# Patient Record
Sex: Female | Born: 1990 | Race: White | Hispanic: No | Marital: Married | Smoking: Never smoker
Health system: Southern US, Community
[De-identification: ages and names within clinical notes are randomized; demographics above are authoritative.]

## PROBLEM LIST (undated history)

## (undated) ENCOUNTER — Inpatient Hospital Stay: Payer: Self-pay

## (undated) DIAGNOSIS — R519 Headache, unspecified: Secondary | ICD-10-CM

## (undated) DIAGNOSIS — S83209A Unspecified tear of unspecified meniscus, current injury, unspecified knee, initial encounter: Secondary | ICD-10-CM

## (undated) DIAGNOSIS — Z8614 Personal history of Methicillin resistant Staphylococcus aureus infection: Secondary | ICD-10-CM

## (undated) DIAGNOSIS — R748 Abnormal levels of other serum enzymes: Secondary | ICD-10-CM

## (undated) DIAGNOSIS — R51 Headache: Secondary | ICD-10-CM

---
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
## 2010-01-01 ENCOUNTER — Emergency Department: Payer: Self-pay | Admitting: Emergency Medicine

## 2010-01-07 ENCOUNTER — Inpatient Hospital Stay: Payer: Self-pay | Admitting: Obstetrics and Gynecology

## 2010-02-16 ENCOUNTER — Emergency Department: Payer: Self-pay | Admitting: Emergency Medicine

## 2010-03-01 ENCOUNTER — Emergency Department: Payer: Self-pay | Admitting: Unknown Physician Specialty

## 2010-07-18 ENCOUNTER — Observation Stay: Payer: Self-pay | Admitting: Obstetrics and Gynecology

## 2010-07-27 ENCOUNTER — Inpatient Hospital Stay: Payer: Self-pay

## 2010-09-11 LAB — HM PAP SMEAR: HM Pap smear: NEGATIVE

## 2010-12-25 ENCOUNTER — Emergency Department: Payer: Self-pay | Admitting: Emergency Medicine

## 2010-12-27 ENCOUNTER — Emergency Department: Payer: Self-pay | Admitting: Emergency Medicine

## 2011-05-27 ENCOUNTER — Emergency Department: Payer: Self-pay | Admitting: *Deleted

## 2011-09-20 ENCOUNTER — Emergency Department: Payer: Self-pay | Admitting: Emergency Medicine

## 2011-09-20 LAB — CBC WITH DIFFERENTIAL/PLATELET
Basophil #: 0 10*3/uL (ref 0.0–0.1)
Basophil %: 0.4 %
Eosinophil #: 0.1 10*3/uL (ref 0.0–0.7)
Eosinophil %: 1.2 %
HGB: 12.3 g/dL (ref 12.0–16.0)
Lymphocyte #: 1.9 10*3/uL (ref 1.0–3.6)
Lymphocyte %: 18.6 %
MCH: 28.8 pg (ref 26.0–34.0)
MCHC: 33.6 g/dL (ref 32.0–36.0)
Monocyte #: 0.7 10*3/uL (ref 0.0–0.7)
Monocyte %: 6.8 %
Neutrophil #: 7.3 10*3/uL — ABNORMAL HIGH (ref 1.4–6.5)
Neutrophil %: 73 %
Platelet: 147 10*3/uL — ABNORMAL LOW (ref 150–440)
RDW: 13.3 % (ref 11.5–14.5)
WBC: 10 10*3/uL (ref 3.6–11.0)

## 2011-09-21 ENCOUNTER — Inpatient Hospital Stay: Payer: Self-pay | Admitting: Internal Medicine

## 2011-09-21 LAB — CBC
HCT: 37.7 % (ref 35.0–47.0)
HGB: 12.7 g/dL (ref 12.0–16.0)
MCH: 29 pg (ref 26.0–34.0)
MCHC: 33.7 g/dL (ref 32.0–36.0)
Platelet: 143 10*3/uL — ABNORMAL LOW (ref 150–440)
RBC: 4.37 10*6/uL (ref 3.80–5.20)
RDW: 13.7 % (ref 11.5–14.5)

## 2011-09-21 LAB — COMPREHENSIVE METABOLIC PANEL
Albumin: 3.9 g/dL (ref 3.4–5.0)
Alkaline Phosphatase: 85 U/L (ref 50–136)
Anion Gap: 11 (ref 7–16)
Co2: 25 mmol/L (ref 21–32)
EGFR (African American): >60
EGFR (Non-African Amer.): >60
Glucose: 125 mg/dL — ABNORMAL HIGH (ref 65–99)
Total Protein: 7.4 g/dL (ref 6.4–8.2)

## 2011-09-22 LAB — CBC WITH DIFFERENTIAL/PLATELET
Eosinophil %: 2.2 %
HGB: 11.8 g/dL — ABNORMAL LOW (ref 12.0–16.0)
Lymphocyte #: 1.6 10*3/uL (ref 1.0–3.6)
Lymphocyte %: 21.8 %
MCH: 29 pg (ref 26.0–34.0)
MCHC: 34.1 g/dL (ref 32.0–36.0)
MCV: 85 fL (ref 80–100)
Monocyte %: 7.6 %
Platelet: 136 10*3/uL — ABNORMAL LOW (ref 150–440)

## 2011-09-22 LAB — VANCOMYCIN, TROUGH: Vancomycin, Trough: 18 ug/mL (ref 10–20)

## 2011-09-25 LAB — WOUND CULTURE

## 2011-12-05 LAB — COMPREHENSIVE METABOLIC PANEL
Albumin: 3.9 g/dL (ref 3.4–5.0)
Alkaline Phosphatase: 85 U/L (ref 50–136)
Anion Gap: 7 (ref 7–16)
Chloride: 105 mmol/L (ref 98–107)
Co2: 31 mmol/L (ref 21–32)
Creatinine: 0.64 mg/dL (ref 0.60–1.30)
EGFR (African American): >60
EGFR (Non-African Amer.): >60
Glucose: 89 mg/dL (ref 65–99)
Osmolality: 285 (ref 275–301)
SGPT (ALT): 76 U/L
Sodium: 143 mmol/L (ref 136–145)
Total Protein: 7.4 g/dL (ref 6.4–8.2)

## 2011-12-05 LAB — CBC
HCT: 41.4 % (ref 35.0–47.0)
MCHC: 32.9 g/dL (ref 32.0–36.0)
MCV: 88 fL (ref 80–100)
Platelet: 164 10*3/uL (ref 150–440)
RBC: 4.7 10*6/uL (ref 3.80–5.20)
RDW: 14 % (ref 11.5–14.5)

## 2011-12-06 ENCOUNTER — Inpatient Hospital Stay: Payer: Self-pay | Admitting: Internal Medicine

## 2011-12-06 LAB — CBC WITH DIFFERENTIAL/PLATELET
Basophil #: 0 10*3/uL (ref 0.0–0.1)
Basophil %: 0.4 %
Lymphocyte %: 36.5 %
MCH: 29.4 pg (ref 26.0–34.0)
MCV: 87 fL (ref 80–100)
Monocyte #: 0.7 x10 3/mm (ref 0.2–0.9)
Monocyte %: 7.9 %
Neutrophil %: 52.8 %
RBC: 4.65 10*6/uL (ref 3.80–5.20)
WBC: 9.1 10*3/uL (ref 3.6–11.0)

## 2011-12-06 LAB — BASIC METABOLIC PANEL
Anion Gap: 6 — ABNORMAL LOW (ref 7–16)
Calcium, Total: 8.8 mg/dL (ref 8.5–10.1)
Chloride: 104 mmol/L (ref 98–107)
Co2: 31 mmol/L (ref 21–32)
Creatinine: 0.51 mg/dL — ABNORMAL LOW (ref 0.60–1.30)
EGFR (Non-African Amer.): >60

## 2011-12-06 LAB — DRUG SCREEN, URINE
Amphetamines, Ur Screen: NEGATIVE (ref ?–1000)
Barbiturates, Ur Screen: NEGATIVE (ref ?–200)
Cocaine Metabolite,Ur ~~LOC~~: NEGATIVE (ref ?–300)
MDMA (Ecstasy)Ur Screen: NEGATIVE (ref ?–500)
Methadone, Ur Screen: NEGATIVE (ref ?–300)
Phencyclidine (PCP) Ur S: NEGATIVE (ref ?–25)
Tricyclic, Ur Screen: NEGATIVE (ref ?–1000)

## 2011-12-06 LAB — HEMOGLOBIN A1C: Hemoglobin A1C: 5.2 % (ref 4.2–6.3)

## 2011-12-06 LAB — HCG, QUANTITATIVE, PREGNANCY: Beta Hcg, Quant.: <1 m[IU]/mL — ABNORMAL LOW

## 2011-12-11 LAB — CULTURE, BLOOD (SINGLE)

## 2011-12-17 LAB — WOUND CULTURE

## 2013-09-15 ENCOUNTER — Emergency Department: Payer: Self-pay | Admitting: Emergency Medicine

## 2013-09-15 LAB — CBC
HCT: 38.8 % (ref 35.0–47.0)
HGB: 13 g/dL (ref 12.0–16.0)
MCH: 28.8 pg (ref 26.0–34.0)
MCHC: 33.7 g/dL (ref 32.0–36.0)
MCV: 86 fL (ref 80–100)
PLATELETS: 171 10*3/uL (ref 150–440)
RBC: 4.54 10*6/uL (ref 3.80–5.20)
RDW: 13.7 % (ref 11.5–14.5)
WBC: 9.8 10*3/uL (ref 3.6–11.0)

## 2013-09-15 LAB — COMPREHENSIVE METABOLIC PANEL
ALT: 21 U/L (ref 12–78)
ANION GAP: 5 — AB (ref 7–16)
Albumin: 4 g/dL (ref 3.4–5.0)
Alkaline Phosphatase: 65 U/L
BILIRUBIN TOTAL: 0.3 mg/dL (ref 0.2–1.0)
BUN: 12 mg/dL (ref 7–18)
CALCIUM: 9 mg/dL (ref 8.5–10.1)
CHLORIDE: 110 mmol/L — AB (ref 98–107)
CO2: 26 mmol/L (ref 21–32)
CREATININE: 0.77 mg/dL (ref 0.60–1.30)
EGFR (Non-African Amer.): >60
GLUCOSE: 97 mg/dL (ref 65–99)
Osmolality: 281 (ref 275–301)
Potassium: 3.7 mmol/L (ref 3.5–5.1)
SGOT(AST): 9 U/L — ABNORMAL LOW (ref 15–37)
SODIUM: 141 mmol/L (ref 136–145)
TOTAL PROTEIN: 7.5 g/dL (ref 6.4–8.2)

## 2013-10-14 ENCOUNTER — Emergency Department: Payer: Self-pay | Admitting: Emergency Medicine

## 2013-10-16 ENCOUNTER — Emergency Department: Payer: Self-pay | Admitting: Emergency Medicine

## 2013-10-16 LAB — COMPREHENSIVE METABOLIC PANEL
ALBUMIN: 3.8 g/dL (ref 3.4–5.0)
ALK PHOS: 104 U/L
ANION GAP: 7 (ref 7–16)
BUN: 10 mg/dL (ref 7–18)
Bilirubin,Total: 0.3 mg/dL (ref 0.2–1.0)
CHLORIDE: 105 mmol/L (ref 98–107)
Calcium, Total: 8.9 mg/dL (ref 8.5–10.1)
Co2: 26 mmol/L (ref 21–32)
Creatinine: 0.63 mg/dL (ref 0.60–1.30)
EGFR (African American): >60
Glucose: 102 mg/dL — ABNORMAL HIGH (ref 65–99)
Osmolality: 275 (ref 275–301)
Potassium: 4.1 mmol/L (ref 3.5–5.1)
SGOT(AST): 47 U/L — ABNORMAL HIGH (ref 15–37)
SGPT (ALT): 90 U/L — ABNORMAL HIGH (ref 12–78)
Sodium: 138 mmol/L (ref 136–145)
Total Protein: 7.9 g/dL (ref 6.4–8.2)

## 2013-10-16 LAB — CBC
HCT: 39.9 % (ref 35.0–47.0)
HGB: 13.5 g/dL (ref 12.0–16.0)
MCH: 29 pg (ref 26.0–34.0)
MCHC: 33.9 g/dL (ref 32.0–36.0)
MCV: 86 fL (ref 80–100)
PLATELETS: 174 10*3/uL (ref 150–440)
RBC: 4.66 10*6/uL (ref 3.80–5.20)
RDW: 13.4 % (ref 11.5–14.5)
WBC: 9.6 10*3/uL (ref 3.6–11.0)

## 2013-10-16 LAB — BETA STREP CULTURE(ARMC)

## 2013-10-16 LAB — TSH: THYROID STIMULATING HORM: 2.66 u[IU]/mL

## 2014-03-08 ENCOUNTER — Emergency Department: Payer: Self-pay | Admitting: Emergency Medicine

## 2014-03-08 LAB — BASIC METABOLIC PANEL
Anion Gap: 5 — ABNORMAL LOW (ref 7–16)
BUN: 17 mg/dL (ref 7–18)
CALCIUM: 8.8 mg/dL (ref 8.5–10.1)
Chloride: 107 mmol/L (ref 98–107)
Co2: 28 mmol/L (ref 21–32)
Creatinine: 0.93 mg/dL (ref 0.60–1.30)
EGFR (Non-African Amer.): >60
Glucose: 122 mg/dL — ABNORMAL HIGH (ref 65–99)
OSMOLALITY: 282 (ref 275–301)
Potassium: 3.8 mmol/L (ref 3.5–5.1)
Sodium: 140 mmol/L (ref 136–145)

## 2014-03-08 LAB — CBC
HCT: 37.3 % (ref 35.0–47.0)
HGB: 13.1 g/dL (ref 12.0–16.0)
MCH: 29.6 pg (ref 26.0–34.0)
MCHC: 35 g/dL (ref 32.0–36.0)
MCV: 85 fL (ref 80–100)
Platelet: 161 10*3/uL (ref 150–440)
RBC: 4.41 10*6/uL (ref 3.80–5.20)
RDW: 13.8 % (ref 11.5–14.5)
WBC: 8.6 10*3/uL (ref 3.6–11.0)

## 2014-03-08 LAB — TROPONIN I: Troponin-I: <0.02 ng/mL

## 2014-06-21 NOTE — L&D Delivery Note (Addendum)
Delivery Note At 9:06 AM a viable female was delivered via Vaginal, Spontaneous Delivery (Presentation: Right Occiput Posterior) after Pitocin IOL for postdates.  APGAR: 7, 9; weight 9 lb 3.4 oz (4180 g).   Placenta status: Intact, Spontaneous.  Cord: 3 vessels with battledore insertion   Anesthesia: Epidural  Episiotomy: None Lacerations: None Suture Repair: N/A Est. Blood Loss (mL): 350  Mom to recovery in LDR.  Baby to Couplet care / Skin to Skin  Colson/ Bottle.  GUTIERREZ, COLLEEN 03/20/2015, 9:26 AM

## 2014-07-25 ENCOUNTER — Ambulatory Visit: Payer: Self-pay | Admitting: Advanced Practice Midwife

## 2014-07-25 LAB — OB RESULTS CONSOLE ABO/RH: RH Type: POSITIVE

## 2014-07-25 LAB — OB RESULTS CONSOLE ANTIBODY SCREEN: Antibody Screen: NEGATIVE

## 2014-07-25 LAB — OB RESULTS CONSOLE VARICELLA ZOSTER ANTIBODY, IGG: Varicella: IMMUNE

## 2014-07-25 LAB — OB RESULTS CONSOLE RUBELLA ANTIBODY, IGM: RUBELLA: IMMUNE

## 2014-07-25 LAB — OB RESULTS CONSOLE HGB/HCT, BLOOD
HCT: 36 %
Hemoglobin: 12.3 g/dL

## 2014-07-25 LAB — OB RESULTS CONSOLE HEPATITIS B SURFACE ANTIGEN: Hepatitis B Surface Ag: NEGATIVE

## 2014-07-25 LAB — OB RESULTS CONSOLE RPR: RPR: NONREACTIVE

## 2014-07-25 LAB — OB RESULTS CONSOLE HIV ANTIBODY (ROUTINE TESTING): HIV: NONREACTIVE

## 2014-07-25 LAB — OB RESULTS CONSOLE PLATELET COUNT: PLATELETS: 164 10*3/uL

## 2014-07-26 LAB — OB RESULTS CONSOLE GC/CHLAMYDIA
CHLAMYDIA, DNA PROBE: NEGATIVE
Gonorrhea: NEGATIVE

## 2014-08-07 ENCOUNTER — Emergency Department: Payer: Self-pay | Admitting: Emergency Medicine

## 2014-08-14 ENCOUNTER — Emergency Department: Payer: Self-pay | Admitting: Emergency Medicine

## 2014-10-07 ENCOUNTER — Encounter
Admit: 2014-10-07 | Disposition: A | Discharge status: Home / Self Care | Payer: Self-pay | Attending: Obstetrics and Gynecology | Admitting: Obstetrics and Gynecology

## 2014-10-13 NOTE — H&P (Signed)
PATIENT NAME:  Toni Carter, Toni Carter MR#:  [id number] DATE OF BIRTH:  [birth year]  DATE OF ADMISSION:  12/06/2011  PRIMARY CARE PHYSICIAN: Unassigned.  CHIEF COMPLAINT: Left toe pain.   SUBJECTIVE: This is a 24 year old female with no past medical history except for recent history of admission for cellulitis of the right knee who presents to the ER with the chief complaint of left toe pain. The patient noticed a small blister around the base of her left toe. About 1-1/2 weeks ago, she went to see her primary care provider, in the [place] are, who prescribed her a course of prednisone and Vicodin for presumed gout. The patient took about a weeks worth of prednisone and vicodin. The patient's pain has progressed to the point that she has difficulty bearing weight. She has also seen increased swelling and erythema and duration of the left toe and the left foot. 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 The patient denies any history of trauma to her left toe.  She states that she wears clothes, shoes, and gloves and denies any scratches or any kind of trauma that she might have been exposed to. The patient denies any history of MRSA. She denies any fever, chills, or rigors at home. She denies any nausea, vomiting, or abdominal pain. She denies any other symptoms. She denies any IV drug use.   PAST MEDICAL HISTORY: History of right knee cellulitis.  PAST SURGICAL HISTORY: None.   ALLERGIES: No known drug allergies.   MEDICATIONS:  1. Recent course of prednisone. 2. Norco 5/325 mg one tablet every 6 hours p.r.n.  FAMILY HISTORY: Father has multiple medical problems including chronic obstructive pulmonary disease, interstitial lung disease, and lung cancer. Grandfather had lung cancer.   SOCIAL HISTORY: She lives in [place]. 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 She denies any use of tobacco. She drinks occasionally.  REVIEW OF SYSTEMS:  CONSTITUTIONAL: No fever, fatigue, weakness, pain, weight loss, or weight gain. EYES: No blurry vision, double vision, pain, redness, inflammation, glaucoma, or cataracts. ENT: No tinnitus, ear pain, hearing loss, or seasonal allergies. RESPIRATORY: No cough, wheezing, hemoptysis, or dyspnea. CARDIOVASCULAR: No chest pain, orthopnea, edema, arrhythmia, or palpitations. GI: No nausea, vomiting, abdominal pain, or hematemesis. GENITOURINARY: No dysuria, hematuria, renal calculi, or frequency. ENDOCRINE: No polyuria, nocturia, thyroid problems, increased sweating, heat or cold intolerance. No easy bruising, bleeding, or swollen glands. INTEGUMENTARY: No acne but does have erythema and duration of the right toe as well as between the fourth and the fifth toe of her left foot. She does have significant swelling over the dorsum and the plantar aspect of her left foot. MUSCULOSKELETAL: Pain with weight bearing in the left toe, in the left foot area. NEURO: No numbness, weakness, dysarthria, or epilepsy. PSYCHIATRIC: No anxiety, insomnia, or bipolar disorder.   PHYSICAL EXAMINATION:   VITAL SIGNS: Temperature 96.6, pulse 69, respirations 18, blood pressure 117/76, and 99% on room air.   GENERAL: Currently comfortable, in no acute cardiopulmonary distress.   HEENT: Pupils equal and reactive to light. Extraocular movements are intact.   NECK: Supple. No JVD. No thyromegaly.   RESPIRATORY: Normal respiratory effort. Clear to auscultation bilaterally. No wheezes, crackles, or rhonchi. No accessory muscle use.  CARDIOVASCULAR: Regular rate and rhythm. No murmurs, rubs, or gallops.   ABDOMEN: Soft, nontender, and nondistended. Normoactive bowel sounds.   EXTREMITIES: Significant edema of the dorsal and the plantar aspect of the  left foot and drainage between the toes of the left foot, between the fourth and fifth digit.   SKIN: As mentioned above.   NEURO: Cranial nerves II through XII grossly intact Deep  tendon reflexes 2+ bilaterally. Balance and gait negative.   PSYCHIATRIC: Appropriate mood and affect, alert and oriented x3.   ASSESSMENT AND PLAN:  1. Left toe/foot cellulitis:  The precipitating cause could be fungal infection between the toes versus poor hygiene. The patient does have several areas of skin breakdown, on the plantar aspect of her left foot. The patient is agreeable to HIV testing because of recurrent skin infections. She denies any IV drug use. We will obtain a blood culture and wound culture and orthopedic consultation will be obtained in the morning. A MRI of the foot will also be obtained to ensure that the patient does not have osteomyelitis in addition to cellulitis. She will be started on vancomycin and Zosyn.  2. History of thrombocytopenia, now resolved.  TIME SPENT FOR THIS ADMISSION: 70 minutes.  ____________________________ Richarda Overlie, MD na:slb D: 12/06/2011 01:13:51 ET T: 12/06/2011 07:17:50 ET JOB#: [id number]  cc: Richarda Overlie, MD, <Dictator> Richarda Overlie MD ELECTRONICALLY SIGNED 12/07/2011 16:51

## 2014-10-13 NOTE — Consult Note (Signed)
Details:    - Pt seen. Small superficial abscess left 4th webspace. Culture of purulence sent. MRI and xray negative. Pt with hx of MRSA. Suspect can be d/c'd on po abx and changed as sensitivities return once received. No I & D needed at this time. Will see in outpt clinic early next week.   Electronic Signatures: Samara Deist (MD)  (Signed [phone] 08:06)  Authored: Details   Last Updated: 18-Jun-13 08:06 by Samara Deist (MD)

## 2014-10-13 NOTE — Consult Note (Signed)
PATIENT NAME:  Toni Carter, Toni Carter MR#:  [id number] DATE OF BIRTH:  September 13, 1990  DATE OF CONSULTATION:  12/07/2011  REFERRING PHYSICIAN:   CONSULTING PHYSICIAN:  Quron Ruddy A. Vickki Muff, DPM  REASON FOR CONSULTATION: Left foot abscess.   HISTORY OF PRESENT ILLNESS: This is a 24 year old female who presented to the ER yesterday with worsening pain and redness into her left foot. The previous week she noticed some redness and swelling into her left foot, basically at the base of the fourth web space on the left forefoot region. This progressed and the pain became quite uncomfortable and was seen in the ER. She does have a history of a MRSA infection on her right knee and believes she was on Bactrim outpatient at that time. This was some time ago, within the last six months. Currently she has some nausea. She states the swelling has come down since being admitted, but still has the same amount of redness to her left foot. She really denies any medical history.   PAST MEDICAL HISTORY: She denies.  PAST SURGICAL HISTORY: She denies.   SOCIAL HISTORY: She works at a Market researcher. She denies smoking. She drinks alcohol occasionally. She lives in [place].   REVIEW OF SYSTEMS: Positive for nausea currently. She denies any fevers or chills of recent. No other skin problems or skin lesions. No major joint problems or joint pain at this time.   PHYSICAL EXAMINATION:   GENERAL: She is alert and oriented, in no apparent distress.   VASCULAR: She has fully palpable DP and PT pulses on her left foot.   NEUROLOGIC: Full sensation is intact to lower extremities.  DERM: At the left fourth web space there is a noted erythematous region with no lymphangitic streaking. The erythema is just surrounding the web space itself. There was a small scab. I removed this and a small amount of purulence was noted in this area. I was able to take a wound culture of this area. I was not able to express a large amount of  purulence as it seemed quite superficial in nature. The erythema does not extend past the metatarsophalangeal joint region.   ORTHO/MUSCULOSKELETAL: She has noted diffuse edema to the left forefoot and midfoot. The ankle seems to be equivalent to the contralateral side.   LABORATORY AND RADIOLOGIC DATA: X-rays do not reveal any gas in the soft tissue.  MRI reveals cellulitis into the left fourth toe with no obvious abscess or deep space abscess at this time.    Lab results reveal a normal white blood cell count at 9.1.   Blood cultures are negative.   ASSESSMENT:  1. Superficial left fourth web space abscess. 2. History of previous MRSA infection, right knee.   PLAN: She is currently on Zosyn and vancomycin. I was able to obtain a culture of purulence in this left forefoot. She believes she was on Bactrim outpatient, which did not require surgery on her right knee. My suspicion is this is likely a MRSA infection and would recommend upon discharge p.o. antibiotics. We will follow the cultures outpatient and chase the cultures up as appropriate. Blood cultures are negative and she does not seem septic. I can follow her while she is inpatient, but suspect she can be discharged in the next 1 to 2 days.  ____________________________ Pete Glatter Vickki Muff, DPM jaf:slb D: 12/07/2011 08:11:26 ET T: 12/07/2011 11:14:44 ET JOB#: [id number]  cc: Larkin Ina A. Vickki Muff, DPM, <Dictator> Charlaine Utsey DPM ELECTRONICALLY SIGNED 12/14/2011 11:08

## 2014-10-13 NOTE — Discharge Summary (Signed)
PATIENT NAME:  Toni Carter, Toni Carter MR#:  [id number] DATE OF BIRTH:  [birth year]  DATE OF ADMISSION:  12/06/2011 DATE OF DISCHARGE:  12/09/2011  PRIMARY CARE PHYSICIAN: Burley Saver, MD   REASON FOR ADMISSION: Left toe pain.   DISCHARGE DIAGNOSES:  1. Left foot cellulitis with fourth webspace abscess with methicillin-resistant Staphylococcus aureus infection.  2. Previous history of right knee cellulitis felt to be due to methicillin-resistant Staphylococcus aureus.   CONSULTATIONS: Podiatry with Dr. Ether Griffins.   DISCHARGE DISPOSITION: Home.   DISCHARGE MEDICATIONS:  1. Bactrim DS 1 tab p.o. b.i.d. x6 days. 2. Percocet 5/325, 1 tab p.o. every 8 hours p.r.n. pain.   DISCHARGE CONDITION: Improved.   DISCHARGE ACTIVITY:  As tolerated.   DISCHARGE DIET: Regular.   DISCHARGE INSTRUCTIONS:  1. Take medications as prescribed.  2. Return to the Emergency Department for recurrence of symptoms, or for worsening pain or redness or swelling in the foot, or for development of fever or chills.   FOLLOW-UP INSTRUCTIONS:   Follow up with Dr. Quillian Quince within one week.    PERTINENT LABORATORY, DIAGNOSTIC AND RADIOLOGICAL DATA:  Left foot x-ray 12/05/2011: There is soft tissue swelling evident, otherwise there are no acute abnormalities noted. No acute bony abnormalities are identified. No lytic areas suspicious for osteomyelitis were seen.  MRI of the left foot with and without contrast on 12/06/2011: There is no evidence of a left toe abscess. There is mild soft tissue swelling around the fourth digit as can be seen with cellulitis.  Comprehensive metabolic panel normal on admission.  CBC normal on admission.  Blood cultures x2 from 12/06/2011: No growth to date. Wound culture 12/07/2011: Heavy growth of MRSA sensitive to clindamycin, ciprofloxacin, Levaquin, vancomycin, linezolid, tigecycline, trimethoprim/sulfamethoxazole.   BRIEF HISTORY/HOSPITAL COURSE: The patient is a 24 year old female with  a history of right knee cellulitis, presented to the Emergency Department with complaints of worsening left foot pain, swelling, and redness. Please see dictated admission History and Physical for pertinent details surrounding the onset of this hospitalization. Please see below for further details.   Left foot cellulitis with fourth web space abscess: The patient was admitted to the hospital and placed on IV antibiotics and pain medications to which she showed excellent response. She had some purulent drainage noted in the fourth webspace of the left foot, and some pus was expressed per Podiatry and sent to the lab for wound culture. The patient was initially maintained on vancomycin and Zosyn therapy. Wound culture later returned positive for MRSA, and after cultures were reviewed she was transitioned off IV onto oral antibiotics. I have recommended a 10-day course of treatment for her cellulitis and abscess which have significantly clinically improved with IV antibiotics. Per Podiatry, the patient does not need outpatient followup with the Podiatry team unless her symptoms worsen or do not improve. Hopefully her cellulitis and abscess will resolve with antibiotic therapy, and she was advised close outpatient follow up with her primary care physician, Dr. Quillian Quince. She has completed 4 out of 10 days of inpatient antibiotic therapy and has 6 additional days remaining at the time of discharge. Blood cultures did not reveal any growth to date. There is no evidence of SIRS or sepsis, and she came with a normal white blood cell count and was afebrile, and her hemodynamics (her blood pressure and heart rate) have been within normal limits for the entirety of this hospitalization. Mobility of the left foot has significantly improved. We obtained an MRI of the left foot  which did not reveal any underlying abscesses and also was not suggestive of underlying osteomyelitis. On 12/09/2011, the patient was hemodynamically stable  with significant improvement of her left foot pain, swelling, and redness and was felt to be stable for discharge home with close outpatient follow-up, to which the patient was agreeable.    TIME SPENT ON DISCHARGE: Greater than 30 minutes.    ____________________________ Elon Alas, MD knl:cbb D: 12/13/2011 20:17:13 ET T: 12/14/2011 15:14:55 ET JOB#: [id number]  cc: Elon Alas, MD, <Dictator> Burley Saver, MD Elon Alas MD ELECTRONICALLY SIGNED 12/21/2011 1:29

## 2014-10-13 NOTE — Discharge Summary (Signed)
PATIENT NAME:  Toni Carter, Toni Carter MR#:  [id number] DATE OF BIRTH:  [birth year]  DATE OF ADMISSION:  09/21/2011 DATE OF DISCHARGE:  09/22/2011  PRIMARY CARE PHYSICIAN: [facility]    DISCHARGE DIAGNOSES:  1. Right knee abscess/cellulitis, draining and improving.  2. Thrombocytopenia likely due to infection, now stable.   SECONDARY DIAGNOSIS: None.   CONSULTATIONS: None.   PROCEDURES/RADIOLOGY:  Right knee x-ray showed no acute abnormality on April 1st.  Blood cultures x2 were negative. Wound culture was growing moderate growth of Staphylococcus aureus and moderate WBCs.   HISTORY AND SHORT HOSPITAL COURSE: The patient is a 24 year old female who was admitted for right knee abscess/cellulitis. She was started on IV vancomycin and was improving. Her wound was already opened and was draining. She had significant improvement at the site of infection with cellulitis very contained around the site of draining abscess. She was also found to be thrombocytopenic and her platelet count remained stable. Those were thought to be low due to infection. On April 3rd she was discharged home in stable condition.   On the date of discharge her vital signs were as follows: Temperature 97.7, heart rate 74 per minute, respirations 18 per minute, blood pressure 117/68 mmHg. She was saturating 97% on room air.   PERTINENT PHYSICAL EXAMINATION: CARDIOVASCULAR: S1, S2 normal. No murmurs, rubs, or gallop. LUNGS: Clear to auscultation bilaterally. No wheezing, rales, rhonchi, or crepitation. ABDOMEN: Soft, benign. NEUROLOGIC: Nonfocal examination. RIGHT KNEE: Dark black eschar with surrounding necrosis on the top of right knee area. Erythema was improving. There was very minimal edema. All other physical examination remained at the baseline.   DISCHARGE MEDICATIONS:  1. Bactrim double-strength 1 tablet p.o. b.i.d. for seven days.  2. Tylenol 650 mg p.o. every six hours as needed.   DISCHARGE DIET: Regular.    DISCHARGE ACTIVITY: As tolerated.   DISCHARGE INSTRUCTIONS AND FOLLOW-UP:  1. The patient was instructed to follow-up with new primary care physician at [facility] on April 18th at 7 p.m.  2. She will need follow-up with Dr. Pat Patrick from [facility] in one week to make sure healing of her right knee abscess.  3. She was also instructed to keep her dressing dry and replace as needed. Keep the wound clean.  4. She may shower but will need to keep the wound covered if she is taking a shower.   TOTAL TIME DISCHARGING THIS PATIENT: 45 minutes.   ____________________________ Lucina Mellow. Manuella Ghazi, MD vss:drc D: 09/22/2011 23:05:02 ET T: 09/23/2011 11:35:24 ET JOB#: [id number]  cc: Rueben Kassim S. Manuella Ghazi, MD, <Dictator> [facility] Rodena Goldmann III, MD [place] MD ELECTRONICALLY SIGNED 09/23/2011 21:52

## 2014-10-13 NOTE — H&P (Signed)
PATIENT NAME:  Toni Carter, Toni Carter MR#:  [id number] DATE OF BIRTH:  January 07, 1991  DATE OF ADMISSION:  09/21/2011  REFERRING PHYSICIAN: Dr. Enedina Finner   PRIMARY CARE PHYSICIAN: None.   PRESENTING COMPLAINT: Right knee pain, swelling, redness, and drainage.   HISTORY OF PRESENT ILLNESS: Ms. Hairfield is a pleasant 24 year old woman with no prior medical history who presents today with reports of worsening right knee pain and swelling and redness. Reports that approximately one week ago started developing a small lesion on the right knee that looked like an ingrown hair. Her symptoms continued to progress to the point where yesterday she was having worsening pain, erythema, edema, as well as difficulty bending her knees and ambulating. She was seen in the ED earlier yesterday morning and had evaluation with attempts of draining her knee and only was able to extract a small amount of fluid. The patient was sent home with oral antibiotics after receiving IV antibiotics in the ED. At home her symptoms worsened with increase in the swelling, pain, and redness and returned for further evaluation. She did get a knee x-ray on her initial visit that did not reveal any effusion or bony abnormalities.   PAST MEDICAL HISTORY: None.   PAST SURGICAL HISTORY: None.   ALLERGIES: None.   MEDICATIONS: None.   FAMILY HISTORY: Father with stroke and lung cancer. He was a tobacco user. Grandfather also lung cancer, also tobacco user.   SOCIAL HISTORY: She lives in [place] alone. She denies any tobacco. She has occasional alcohol use. No drug use.  REVIEW OF SYSTEMS: CONSTITUTIONAL: No fevers, nausea, vomiting. EYES: No changes in vision. ENT: No epistaxis, discharge, or dysphagia. RESPIRATORY: No cough, wheezing, hemoptysis. CARDIOVASCULAR: No chest pain, orthopnea, palpitations, syncope. GI: No nausea, vomiting, abdominal pain, hematemesis, or melena. GU: No dysuria or hematuria. ENDOCRINE: No polyuria or polydipsia. HEME: No  easy bleeding. SKIN: No ulcers. MUSCULOSKELETAL: As per history of present illness. NEUROLOGIC: No one-sided weakness or numbness. PSYCH: Denies any depression or suicidal ideation.   PHYSICAL EXAMINATION:   VITAL SIGNS: Temperature 99.6, pulse 131, respiratory rate 24, blood pressure 127/70, sating at 98% on room air.   GENERAL: Lying in bed in no apparent distress.   HEENT: Normocephalic, atraumatic. Pupils are equal, symmetric, nonicteric. Nares without discharge. Moist mucous membrane.   NECK: Soft and supple. No adenopathy or JVP.   CARDIOVASCULAR: Slightly tachy. No murmurs, rubs, or gallops.   LUNGS: Clear to auscultation bilaterally. No use of accessory muscles or increased respiratory effort.   ABDOMEN: Soft. Positive bowel sounds. No mass appreciated.   EXTREMITIES: Edema over the anterior/inferior knee and dark eschar with some necrosis centrally in the area of erythema and edema. There is positive warmth. Dorsal pedis pulses intact.   MUSCULOSKELETAL: Does not appear to have joint effusion. She has decreased mobilization and flexion of her right knee and pain with manipulation.   PSYCH: The patient is alert and oriented.   NEUROLOGIC: No dysarthria or aphasia. Symmetrical strength of upper extremities. She has decreased strength of the lower extremity due to pain.   ASSESSMENT AND PLAN: Ms. Ao is a 24 year old woman with no prior medical history presenting with right knee pain, swelling, and erythema.  1. Right knee cellulitis and abscess. Will administer vancomycin as more than likely this is MRSA given progression of disease after initial treatment. It has already drained on its own, unable to extract more fluid. Will send wound culture. The area of erythema has been marked. Will continue to  follow on IV antibiotics. Her right knee film also does not reveal effusion consistent with exam. If symptoms do not get worse, consider orthopedic surgeon consultation. Continue to  follow her tachycardia and leukocytosis which is likely in the setting of infection.  2. Thrombocytopenia. This too could be in the setting of infection but in review of her previous labs when patient was admitted to OB/GYN her platelets were low at that time. Will need to follow and need to establish with PCP for follow-up upon discharge especially if she has persistent thrombocytopenia.  3. Prophylaxis with Lovenox and encourage p.o. intake.   TIME SPENT: Approximately 40 minutes spent on patient care.   ____________________________ Reuel Derby, MD ap:drc D: 09/21/2011 06:40:19 ET T: 09/21/2011 09:30:00 ET JOB#: [id number]  cc: Pearlean Brownie [place] Wanner, MD, <Dictator> Reuel Derby MD ELECTRONICALLY SIGNED 10/15/2011 22:51

## 2014-10-30 ENCOUNTER — Encounter: Payer: Self-pay | Admitting: *Deleted

## 2014-10-30 ENCOUNTER — Observation Stay
Admission: EM | Admit: 2014-10-30 | Discharge: 2014-10-30 | Disposition: A | Discharge status: Home / Self Care | Payer: Medicaid Other | Attending: Certified Nurse Midwife | Admitting: Certified Nurse Midwife

## 2014-10-30 DIAGNOSIS — Z3A21 21 weeks gestation of pregnancy: Secondary | ICD-10-CM | POA: Insufficient documentation

## 2014-10-30 DIAGNOSIS — O26892 Other specified pregnancy related conditions, second trimester: Principal | ICD-10-CM | POA: Insufficient documentation

## 2014-10-30 DIAGNOSIS — O26899 Other specified pregnancy related conditions, unspecified trimester: Secondary | ICD-10-CM

## 2014-10-30 DIAGNOSIS — R109 Unspecified abdominal pain: Secondary | ICD-10-CM | POA: Diagnosis present

## 2014-10-30 DIAGNOSIS — R102 Pelvic and perineal pain: Secondary | ICD-10-CM

## 2014-10-30 HISTORY — DX: Headache, unspecified: R51.9

## 2014-10-30 HISTORY — DX: Headache: R51

## 2014-10-30 LAB — URINALYSIS COMPLETE WITH MICROSCOPIC (ARMC ONLY)
BACTERIA UA: NONE SEEN
Bilirubin Urine: NEGATIVE
Glucose, UA: NEGATIVE mg/dL
HGB URINE DIPSTICK: NEGATIVE
Ketones, ur: NEGATIVE mg/dL
Leukocytes, UA: NEGATIVE
NITRITE: NEGATIVE
PH: 6 (ref 5.0–8.0)
Protein, ur: NEGATIVE mg/dL
Specific Gravity, Urine: 1.027 (ref 1.005–1.030)
Squamous Epithelial / LPF: NONE SEEN
WBC, UA: NONE SEEN WBC/hpf (ref 0–5)

## 2014-10-30 LAB — CHLAMYDIA/NGC RT PCR (ARMC ONLY)
Chlamydia Tr: NOT DETECTED
N gonorrhoeae: NOT DETECTED

## 2014-10-30 MED ORDER — CEPHALEXIN 500 MG PO CAPS
500.0000 mg | ORAL_CAPSULE | ORAL | Status: DC
  Filled 2014-10-30: qty 1

## 2014-10-30 NOTE — Discharge Instructions (Signed)
Asymptomatic Bacteriuria Asymptomatic bacteriuria is the presence of a large number of bacteria in your urine without the usual symptoms of burning or frequent urination. The following conditions increase the risk of asymptomatic bacteriuria:  Diabetes mellitus.  Advanced age.  Pregnancy in the first trimester.  Kidney stones.  Kidney transplants.  Leaky kidney tube valve in young children (reflux). Treatment for this condition is not needed in most people and can lead to other problems such as too much yeast and growth of resistant bacteria. However, some people, such as pregnant women, do need treatment to prevent kidney infection. Asymptomatic bacteriuria in pregnancy is also associated with fetal growth restriction, premature labor, and newborn death. HOME CARE INSTRUCTIONS Monitor your condition for any changes. The following actions may help to relieve any discomfort you are feeling:  Drink enough water and fluids to keep your urine clear or pale yellow. Go to the bathroom more often to keep your bladder empty.  Keep the area around your vagina and rectum clean. Wipe yourself from front to back after urinating. SEEK IMMEDIATE MEDICAL CARE IF:  You develop signs of an infection such as:  Burning with urination.  Frequency of voiding.  Back pain.  Fever.  You have blood in the urine.  You develop a fever. MAKE SURE YOU:  Understand these instructions.  Will watch your condition.  Will get help right away if you are not doing well or get worse. Document Released: 06/07/2005 Document Revised: 10/22/2013 Document Reviewed: 11/27/2012 [facility] Patient Information 2015 [place], [place]. This information is not intended to replace advice given to you by your health care provider. Make sure you discuss any questions you have with your health care provider. Vaginal Bleeding During Pregnancy, Second Trimester A small amount of bleeding (spotting) from the vagina is relatively  common in pregnancy. It usually stops on its own. Various things can cause bleeding or spotting in pregnancy. Some bleeding may be related to the pregnancy, and some may not. Sometimes the bleeding is normal and is not a problem. However, bleeding can also be a sign of something serious. Be sure to tell your health care provider about any vaginal bleeding right away. Some possible causes of vaginal bleeding during the second trimester include:  Infection, inflammation, or growths on the cervix.   The placenta may be partially or completely covering the opening of the cervix inside the uterus (placenta previa).  The placenta may have separated from the uterus (abruption of the placenta).   You may be having early (preterm) labor.   The cervix may not be strong enough to keep a baby inside the uterus (cervical insufficiency).   Tiny cysts may have developed in the uterus instead of pregnancy tissue (molar pregnancy). HOME CARE INSTRUCTIONS  Watch your condition for any changes. The following actions may help to lessen any discomfort you are feeling:  Follow your health care provider's instructions for limiting your activity. If your health care provider orders bed rest, you may need to stay in bed and only get up to use the bathroom. However, your health care provider may allow you to continue light activity.  If needed, make plans for someone to help with your regular activities and responsibilities while you are on bed rest.  Keep track of the number of pads you use each day, how often you change pads, and how soaked (saturated) they are. Write this down.  Do not use tampons. Do not douche.  Do not have sexual intercourse or orgasms until  approved by your health care provider.  If you pass any tissue from your vagina, save the tissue so you can show it to your health care provider.  Only take over-the-counter or prescription medicines as directed by your health care provider.  Do  not take aspirin because it can make you bleed.  Do not exercise or perform any strenuous activities or heavy lifting without your health care provider's permission.  Keep all follow-up appointments as directed by your health care provider. SEEK MEDICAL CARE IF:  You have any vaginal bleeding during any part of your pregnancy.  You have cramps or labor pains.  You have a fever, not controlled by medicine. SEEK IMMEDIATE MEDICAL CARE IF:   You have severe cramps in your back or belly (abdomen).  You have contractions.  You have chills.  You pass large clots or tissue from your vagina.  Your bleeding increases.  You feel light-headed or weak, or you have fainting episodes.  You are leaking fluid or have a gush of fluid from your vagina. MAKE SURE YOU:  Understand these instructions.  Will watch your condition.  Will get help right away if you are not doing well or get worse. Document Released: 03/17/2005 Document Revised: 06/12/2013 Document Reviewed: 02/12/2013 [facility] Patient Information 2015 [place], [place]. This information is not intended to replace advice given to you by your health care provider. Make sure you discuss any questions you have with your health care provider.

## 2014-10-30 NOTE — OB Triage Provider Note (Signed)
24 year old G2 p1001 with EDC=03/12/2015 by a 7wk1d ultrasound presents at 21 weeks with c/o a bad odor to urine and urinary frequency x 3 days. Today she also began having lower abdominal pain. Also c/o a vaginal discharge over the last week which turns brown in her underwear. Denies vulvovaginal itching or irritation. No new sexual partners.  PNC at [facility]  Remarkable for obesity and a hx of migraines. She has had a net weight loss of 16#, although she denies problems with N/V or poor appetitie. Hx of VAVD in 2012 delivering a 8#3oz female. Allergies  Allergen Reactions  . Morphine And Related Shortness Of Breath  . Tape Hives     Medication List    ASK your doctor about these medications        prenatal multivitamin Tabs tablet  Take 1 tablet by mouth daily at 12 noon.        O: BP 107/67 mmHg  Pulse 69  Temp(Src) 98.6 F (37 C) (Oral)  Resp 14  Ht 5\' 10"  (1.778 m)  Wt 103.42 kg (228 lb)  BMI 32.71 kg/m2  FHT 145 Gen: in NAD, appears comfortable ABD: soft, NT, BS active No CVAT SSE: negative pooling, Nitrazine, and fern Wet prep: negative clue cells, Trich, and hyphae, but TNTC WBCs Vulva: no inflammation or lesions Vagina: small amt of off white mucoid discharge CX: closed, long, OOP. Scant amt of blood at cx os after being touched by speculum  Urine appeared grossly turbid, but UA was essentially negative Chlamydia/GC still pending  A: IUP at 21 weeks with history very suspicious for UTI P: urine culture Start Keflex 500 mgm tid while awaiting urine culture Encouraged to force fluids and decrease caffeine Will call with culture results [phone] Given RX for Fioricet in case of migraines DC home Jaymison Luber, [place]

## 2014-10-30 NOTE — OB Triage Note (Signed)
States that there is thin fluid on her underwear several days a week.  States that she has pelvic pain with foul smelling urine.

## 2014-10-30 NOTE — OB Triage Note (Signed)
Patient discharged home with prescription

## 2014-12-17 ENCOUNTER — Observation Stay
Admission: EM | Admit: 2014-12-17 | Discharge: 2014-12-17 | Disposition: A | Discharge status: Home / Self Care | Payer: Medicaid Other | Attending: Obstetrics and Gynecology | Admitting: Obstetrics and Gynecology

## 2014-12-17 DIAGNOSIS — O42913 Preterm premature rupture of membranes, unspecified as to length of time between rupture and onset of labor, third trimester: Secondary | ICD-10-CM | POA: Diagnosis not present

## 2014-12-17 DIAGNOSIS — Z3A34 34 weeks gestation of pregnancy: Secondary | ICD-10-CM | POA: Insufficient documentation

## 2014-12-17 MED ORDER — ACETAMINOPHEN 325 MG PO TABS: 650.0000 mg | ORAL_TABLET | ORAL | Status: DC | PRN

## 2014-12-17 MED ORDER — CALCIUM CARBONATE ANTACID 500 MG PO CHEW: 2.0000 | CHEWABLE_TABLET | ORAL | Status: DC | PRN

## 2014-12-17 NOTE — H&P (Signed)
Obstetric H&P   Chief Complaint: Leaking fluid   Prenatal Care Provider: [facility]  History of Present Illness: 24 y.o. G2P1001 at [redacted]w[redacted]d by [facility] of 01/26/2015 presenting for leaking fluid.  Increased clear discharge today.  +FM, no LOF, no ctx. Prenatal care at [facility] uncomplicated to date  ABO, Rh: AB/Positive/-- (02/04 0000)  Antibody: Negative (02/04 0000)  Rubella:   Immune by history Varicella: Immune RPR: Nonreactive (02/04 0000)  HBsAg: Negative (02/04 0000)  HIV: Non-reactive (02/04 0000)  RPR: Nonreactive (02/04 0000) 1-hr: 111  Review of Systems: 10 point review of systems negative unless otherwise noted in HPI  Past Medical History: Past Medical History  Diagnosis Date  . Headache     Past Surgical History: Past Surgical History  Procedure Laterality Date  . No past surgeries      Family History: No family history on file.  Social History: History   Social History  . Marital Status: Legally Separated    Spouse Name: N/A  . Number of Children: N/A  . Years of Education: N/A   Occupational History  . Not on file.   Social History Main Topics  . Smoking status: Never Smoker   . Smokeless tobacco: Not on file  . Alcohol Use: No  . Drug Use: No  . Sexual Activity: Yes   Other Topics Concern  . Not on file   Social History Narrative  . No narrative on file    Medications: Prior to Admission medications   Medication Sig Start Date End Date Taking? Authorizing Provider  Prenatal Vit-Fe Fumarate-FA (PRENATAL MULTIVITAMIN) TABS tablet Take 1 tablet by mouth daily at 12 noon.    Historical Provider, MD    Allergies: Allergies  Allergen Reactions  . Morphine And Related Shortness Of Breath  . Tape Hives    Physical Exam: Vitals: Blood pressure 99/52, pulse 82.  FHT: 140, moderate, +accels, no decels Toco: none  General: NAD HEENT: Normocephalic, anicteric Pulmonary: CTAB Cardiovascular: RRR Abdomen: Gravid,  Non-tender Genitourinary: normal  external female genitalia, cervix visually closed, no pooling, negative nitrazine, no ferning Extremities: no edema  Labs: No results found for this or any previous visit (from the past 24 hour(s)).  Assessment: 24 y.o. G2P1 presenting for r/o rupture of mebranes  Plan: 1) r/o ROM - no evidence of ROM  2) Fetus - category I tracing, reactive NST  3) PNL - AB/Positive/-- (02/04 0000) / Negative (02/04 0000) /   / Varicella Immune Nonreactive (02/04 0000) / Negative (02/04 0000) / Nonreactive (02/04 0000)  / Non-reactive (02/04 0000) / 1-hr OGTT 111  4) Disposition - discharge home, follow up 6/29 [facility]

## 2014-12-18 ENCOUNTER — Encounter: Payer: Self-pay | Admitting: *Deleted

## 2014-12-18 ENCOUNTER — Observation Stay
Admission: EM | Admit: 2014-12-18 | Discharge: 2014-12-18 | Disposition: A | Discharge status: Home / Self Care | Payer: Medicaid Other | Attending: Advanced Practice Midwife | Admitting: Advanced Practice Midwife

## 2014-12-18 DIAGNOSIS — O26893 Other specified pregnancy related conditions, third trimester: Principal | ICD-10-CM | POA: Insufficient documentation

## 2014-12-18 DIAGNOSIS — O368190 Decreased fetal movements, unspecified trimester, not applicable or unspecified: Secondary | ICD-10-CM | POA: Diagnosis present

## 2014-12-18 DIAGNOSIS — R031 Nonspecific low blood-pressure reading: Secondary | ICD-10-CM | POA: Insufficient documentation

## 2014-12-18 DIAGNOSIS — Z3A28 28 weeks gestation of pregnancy: Secondary | ICD-10-CM | POA: Diagnosis not present

## 2014-12-18 DIAGNOSIS — O368130 Decreased fetal movements, third trimester, not applicable or unspecified: Secondary | ICD-10-CM | POA: Insufficient documentation

## 2014-12-18 NOTE — OB Triage Note (Signed)
[facility] and low blood pressure. Toni Carter

## 2014-12-18 NOTE — Progress Notes (Signed)
24 yo G2P1001 with EDD of 03/12/15 per 7 wk [place], presents at 28 wks for evaluation from [facility] after BP of 90/60 today and pt's c/o decreased FM. When pt arrived, pt states she was sent over for dehydration and low BP. Pt had glucola today and has been feeling tired. Reports hgb was 12 today. Pt admits to low food intake with weight loss this pregnancy. Upon arrival pt states she is feeling fine and baby is moving well. NST was obtained, category 1 with baseline 135, mod variability and + accels, no decels. Pt denies LOF, ROM or VB.   BP: 112/57, pulse 74  Pt discharged in stable condition. Will f/u as scheduled at [facility] in 2 wks. Pt also states she had upcoming Ultrasound appt, for uncertain indication. She will get clarification from [facility] regarding this.

## 2014-12-30 ENCOUNTER — Ambulatory Visit
Admission: RE | Admit: 2014-12-30 | Discharge: 2014-12-30 | Disposition: A | Discharge status: Home / Self Care | Payer: Medicaid Other | Source: Ambulatory Visit | Attending: Obstetrics and Gynecology | Admitting: Obstetrics and Gynecology

## 2014-12-30 ENCOUNTER — Other Ambulatory Visit: Payer: Self-pay | Admitting: Obstetrics and Gynecology

## 2014-12-30 VITALS — BP 130/77 | HR 108 | Temp 98.5°F | Resp 18 | Ht 70.0 in | Wt 224.0 lb

## 2014-12-30 DIAGNOSIS — O99210 Obesity complicating pregnancy, unspecified trimester: Secondary | ICD-10-CM | POA: Insufficient documentation

## 2014-12-30 LAB — US OB FOLLOW UP

## 2015-01-18 ENCOUNTER — Observation Stay
Admission: EM | Admit: 2015-01-18 | Discharge: 2015-01-18 | Disposition: A | Discharge status: Home / Self Care | Payer: Medicaid Other | Attending: Obstetrics & Gynecology | Admitting: Obstetrics & Gynecology

## 2015-01-18 DIAGNOSIS — O9989 Other specified diseases and conditions complicating pregnancy, childbirth and the puerperium: Secondary | ICD-10-CM

## 2015-01-18 DIAGNOSIS — M549 Dorsalgia, unspecified: Secondary | ICD-10-CM | POA: Insufficient documentation

## 2015-01-18 DIAGNOSIS — O26893 Other specified pregnancy related conditions, third trimester: Principal | ICD-10-CM | POA: Insufficient documentation

## 2015-01-18 DIAGNOSIS — Z3A32 32 weeks gestation of pregnancy: Secondary | ICD-10-CM | POA: Insufficient documentation

## 2015-01-18 DIAGNOSIS — O99891 Other specified diseases and conditions complicating pregnancy: Secondary | ICD-10-CM | POA: Diagnosis present

## 2015-01-18 LAB — URINALYSIS COMPLETE WITH MICROSCOPIC (ARMC ONLY)
Bilirubin Urine: NEGATIVE
Glucose, UA: 150 mg/dL — AB
Hgb urine dipstick: NEGATIVE
Leukocytes, UA: NEGATIVE
Nitrite: NEGATIVE
PH: 5 (ref 5.0–8.0)
Protein, ur: 100 mg/dL — AB
Specific Gravity, Urine: 1.043 — ABNORMAL HIGH (ref 1.005–1.030)

## 2015-01-18 NOTE — Discharge Instructions (Signed)
BACK: Toni Carter (Sciatica) Sciatica Sciatica is pain, weakness, numbness, or tingling along the path of the sciatic nerve. The nerve starts in the lower back and runs down the back of each leg. The nerve controls the muscles in the lower leg and in the back of the knee, while also providing sensation to the back of the thigh, lower leg, and the sole of your foot. Sciatica is a symptom of another medical condition. For instance, nerve damage or certain conditions, such as a herniated disk or bone spur on the spine, pinch or put pressure on the sciatic nerve. This causes the pain, weakness, or other sensations normally associated with sciatica. Generally, sciatica only affects one side of the body. CAUSES   Herniated or slipped disc.  Degenerative disk disease.  A pain disorder involving the narrow muscle in the buttocks (piriformis syndrome).  Pelvic injury or fracture.  Pregnancy.  Tumor (rare). SYMPTOMS  Symptoms can vary from mild to very severe. The symptoms usually travel from the low back to the buttocks and down the back of the leg. Symptoms can include:  Mild tingling or dull aches in the lower back, leg, or hip.  Numbness in the back of the calf or sole of the foot.  Burning sensations in the lower back, leg, or hip.  Sharp pains in the lower back, leg, or hip.  Leg weakness.  Severe back pain inhibiting movement. These symptoms may get worse with coughing, sneezing, laughing, or prolonged sitting or standing. Also, being overweight may worsen symptoms. DIAGNOSIS  Your caregiver will perform a physical exam to look for common symptoms of sciatica. He or she may ask you to do certain movements or activities that would trigger sciatic nerve pain. Other tests may be performed to find the cause of the sciatica. These may include:  Blood tests.  X-rays.  Imaging tests, such as an MRI or CT scan. TREATMENT  Treatment is directed at the cause of the sciatic pain.  Sometimes, treatment is not necessary and the pain and discomfort goes away on its own. If treatment is needed, your caregiver may suggest:  Over-the-counter medicines to relieve pain.  Prescription medicines, such as anti-inflammatory medicine, muscle relaxants, or narcotics.  Applying heat or ice to the painful area.  Steroid injections to lessen pain, irritation, and inflammation around the nerve.  Reducing activity during periods of pain.  Exercising and stretching to strengthen your abdomen and improve flexibility of your spine. Your caregiver may suggest losing weight if the extra weight makes the back pain worse.  Physical therapy.  Surgery to eliminate what is pressing or pinching the nerve, such as a bone spur or part of a herniated disk. HOME CARE INSTRUCTIONS   Only take over-the-counter or prescription medicines for pain or discomfort as directed by your caregiver.  Apply ice to the affected area for 20 minutes, 3-4 times a day for the first 48-72 hours. Then try heat in the same way.  Exercise, stretch, or perform your usual activities if these do not aggravate your pain.  Attend physical therapy sessions as directed by your caregiver.  Keep all follow-up appointments as directed by your caregiver.  Do not wear high heels or shoes that do not provide proper support.  Check your mattress to see if it is too soft. A firm mattress may lessen your pain and discomfort. SEEK IMMEDIATE MEDICAL CARE IF:   You lose control of your bowel or bladder (incontinence).  You have increasing weakness in the  lower back, pelvis, buttocks, or legs.  You have redness or swelling of your back.  You have a burning sensation when you urinate.  You have pain that gets worse when you lie down or awakens you at night.  Your pain is worse than you have experienced in the past.  Your pain is lasting longer than 4 weeks.  You are suddenly losing weight without reason. MAKE SURE  YOU:  Understand these instructions.  Will watch your condition.  Will get help right away if you are not doing well or get worse. Document Released: 06/01/2001 Document Revised: 12/07/2011 Document Reviewed: 10/17/2011 [facility] Patient Information 2015 [place], [place]. This information is not intended to replace advice given to you by your health care provider. Make sure you discuss any questions you have with your health care provider.   Crouched on balls of the feet, shift weight into the heels. Extend arms forward. Separate knees for comfort. Hold position for ___ breaths. Repeat ___ times. Do ___ times per day.  Copyright  VHI. All rights reserved.  Sciatica Sciatica is pain, weakness, numbness, or tingling along the path of the sciatic nerve. The nerve starts in the lower back and runs down the back of each leg. The nerve controls the muscles in the lower leg and in the back of the knee, while also providing sensation to the back of the thigh, lower leg, and the sole of your foot. Sciatica is a symptom of another medical condition. For instance, nerve damage or certain conditions, such as a herniated disk or bone spur on the spine, pinch or put pressure on the sciatic nerve. This causes the pain, weakness, or other sensations normally associated with sciatica. Generally, sciatica only affects one side of the body. CAUSES   Herniated or slipped disc.  Degenerative disk disease.  A pain disorder involving the narrow muscle in the buttocks (piriformis syndrome).  Pelvic injury or fracture.  Pregnancy.  Tumor (rare). SYMPTOMS  Symptoms can vary from mild to very severe. The symptoms usually travel from the low back to the buttocks and down the back of the leg. Symptoms can include:  Mild tingling or dull aches in the lower back, leg, or hip.  Numbness in the back of the calf or sole of the foot.  Burning sensations in the lower back, leg, or hip.  Sharp pains in the lower  back, leg, or hip.  Leg weakness.  Severe back pain inhibiting movement. These symptoms may get worse with coughing, sneezing, laughing, or prolonged sitting or standing. Also, being overweight may worsen symptoms. DIAGNOSIS  Your caregiver will perform a physical exam to look for common symptoms of sciatica. He or she may ask you to do certain movements or activities that would trigger sciatic nerve pain. Other tests may be performed to find the cause of the sciatica. These may include:  Blood tests.  X-rays.  Imaging tests, such as an MRI or CT scan. TREATMENT  Treatment is directed at the cause of the sciatic pain. Sometimes, treatment is not necessary and the pain and discomfort goes away on its own. If treatment is needed, your caregiver may suggest:  Over-the-counter medicines to relieve pain.  Prescription medicines, such as anti-inflammatory medicine, muscle relaxants, or narcotics.  Applying heat or ice to the painful area.  Steroid injections to lessen pain, irritation, and inflammation around the nerve.  Reducing activity during periods of pain.  Exercising and stretching to strengthen your abdomen and improve flexibility of your spine.  Your caregiver may suggest losing weight if the extra weight makes the back pain worse.  Physical therapy.  Surgery to eliminate what is pressing or pinching the nerve, such as a bone spur or part of a herniated disk. HOME CARE INSTRUCTIONS   Only take over-the-counter or prescription medicines for pain or discomfort as directed by your caregiver.  Apply ice to the affected area for 20 minutes, 3-4 times a day for the first 48-72 hours. Then try heat in the same way.  Exercise, stretch, or perform your usual activities if these do not aggravate your pain.  Attend physical therapy sessions as directed by your caregiver.  Keep all follow-up appointments as directed by your caregiver.  Do not wear high heels or shoes that do not  provide proper support.  Check your mattress to see if it is too soft. A firm mattress may lessen your pain and discomfort. SEEK IMMEDIATE MEDICAL CARE IF:   You lose control of your bowel or bladder (incontinence).  You have increasing weakness in the lower back, pelvis, buttocks, or legs.  You have redness or swelling of your back.  You have a burning sensation when you urinate.  You have pain that gets worse when you lie down or awakens you at night.  Your pain is worse than you have experienced in the past.  Your pain is lasting longer than 4 weeks.  You are suddenly losing weight without reason. MAKE SURE YOU:  Understand these instructions.  Will watch your condition.  Will get help right away if you are not doing well or get worse. Document Released: 06/01/2001 Document Revised: 12/07/2011 Document Reviewed: 10/17/2011 [facility] Patient Information 2015 [place], [place]. This information is not intended to replace advice given to you by your health care provider. Make sure you discuss any questions you have with your health care provider.

## 2015-01-18 NOTE — OB Triage Note (Signed)
Patient complains of bilateral low back pain since Wednesday.  Rates pain 5/10.  Also complains of sharp shooting pain down right leg.

## 2015-01-22 NOTE — Progress Notes (Signed)
This is an extremely late note due to the inability to log on to the computer for many hours. Patient was seen, examined by me for low back pain and radiation down back of leg.  Brief physical exam:  No cva ttp bilaterally.  Urine neg for evidence of UTI.  No contractions  FHT: 135 mod + accels, no decels Toco: irritable  A/P 24yo G2P1 @ 32w with sciatic pain 1. Recommended stretches, tennis ball massage, prenatal massage, and then lastly chiropractor and physical therapy. 2. D/c home\  ----- Larey Days, MD Attending Obstetrician and Gynecologist [facility]

## 2015-01-27 ENCOUNTER — Ambulatory Visit
Admission: RE | Admit: 2015-01-27 | Discharge: 2015-01-27 | Disposition: A | Discharge status: Home / Self Care | Payer: Medicaid Other | Source: Ambulatory Visit | Attending: Obstetrics and Gynecology | Admitting: Obstetrics and Gynecology

## 2015-01-27 DIAGNOSIS — O99210 Obesity complicating pregnancy, unspecified trimester: Secondary | ICD-10-CM | POA: Diagnosis present

## 2015-01-27 DIAGNOSIS — O99213 Obesity complicating pregnancy, third trimester: Secondary | ICD-10-CM | POA: Insufficient documentation

## 2015-01-27 DIAGNOSIS — Z3A33 33 weeks gestation of pregnancy: Secondary | ICD-10-CM | POA: Insufficient documentation

## 2015-01-27 LAB — US OB FOLLOW UP

## 2015-01-27 NOTE — ED Notes (Signed)
Patient left prior to vital signs being obtained and RN assessment completed.

## 2015-02-14 LAB — OB RESULTS CONSOLE GBS: STREP GROUP B AG: NEGATIVE

## 2015-02-14 LAB — OB RESULTS CONSOLE GC/CHLAMYDIA
Chlamydia: NEGATIVE
Gonorrhea: NEGATIVE

## 2015-02-28 ENCOUNTER — Observation Stay
Admission: EM | Admit: 2015-02-28 | Discharge: 2015-02-28 | Disposition: A | Discharge status: Home / Self Care | Payer: Medicaid Other | Attending: Obstetrics & Gynecology | Admitting: Obstetrics & Gynecology

## 2015-02-28 DIAGNOSIS — M549 Dorsalgia, unspecified: Secondary | ICD-10-CM | POA: Diagnosis not present

## 2015-02-28 DIAGNOSIS — O26893 Other specified pregnancy related conditions, third trimester: Secondary | ICD-10-CM | POA: Insufficient documentation

## 2015-02-28 DIAGNOSIS — O479 False labor, unspecified: Secondary | ICD-10-CM | POA: Diagnosis present

## 2015-02-28 LAB — URINALYSIS COMPLETE WITH MICROSCOPIC (ARMC ONLY)
BILIRUBIN URINE: NEGATIVE
GLUCOSE, UA: NEGATIVE mg/dL
Hgb urine dipstick: NEGATIVE
KETONES UR: NEGATIVE mg/dL
NITRITE: NEGATIVE
PROTEIN: NEGATIVE mg/dL
SPECIFIC GRAVITY, URINE: 1.01 (ref 1.005–1.030)
pH: 6 (ref 5.0–8.0)

## 2015-02-28 MED ORDER — NITROFURANTOIN MONOHYD MACRO 100 MG PO CAPS
100.0000 mg | ORAL_CAPSULE | Freq: Two times a day (BID) | ORAL | Status: DC
  Administered 2015-02-28: 100 mg via ORAL
  Filled 2015-02-28: qty 1

## 2015-02-28 MED ORDER — ACETAMINOPHEN 325 MG PO TABS: 650.0000 mg | ORAL_TABLET | ORAL | Status: DC | PRN

## 2015-02-28 MED ORDER — NITROFURANTOIN MONOHYD MACRO 100 MG PO CAPS: 100.0000 mg | ORAL_CAPSULE | Freq: Two times a day (BID) | ORAL | Status: DC

## 2015-02-28 MED ORDER — ONDANSETRON HCL 4 MG/2ML IJ SOLN: 4.0000 mg | Freq: Four times a day (QID) | INTRAMUSCULAR | Status: DC | PRN

## 2015-02-28 NOTE — OB Triage Note (Signed)
Presents with complaints of leakage since this morning around 8 am. Denies bleeding. States she is still leaking fluid now.   Denies contractions. Is having some back pain and pelvic pressure.

## 2015-02-28 NOTE — H&P (Addendum)
Obstetrics Triage History & Physical   Urinary leakage or amniotic fluid leakage   HPI:  24 y.o. G2P1 @ [redacted]w[redacted]d (03/12/2015, by Other Basis). Admitted on 02/28/2015:   Patient Active Problem List   Diagnosis Date Noted  . Irregular contractions 02/28/2015  . Back pain affecting pregnancy 01/18/2015  . Decreased fetal movement 12/18/2014  . Labor and delivery indication for care or intervention 12/17/2014  . Pelvic pain affecting pregnancy 10/30/2014     Presents for leakage of fluid.  No ctxs  No VB.  Prenatal care at: at [facility]  PMHx:  Past Medical History  Diagnosis Date  . Headache    PSHx:  Past Surgical History  Procedure Laterality Date  . No past surgeries     Medications:  Prescriptions prior to admission  Medication Sig Dispense Refill Last Dose  . Prenatal Vit-Fe Fumarate-FA (PRENATAL MULTIVITAMIN) TABS tablet Take 1 tablet by mouth daily at 12 noon.   Taking   Allergies: is allergic to morphine and related and tape. OBHx:  OB History  Gravida Para Term Preterm AB SAB TAB Ectopic Multiple Living  2 1        1     # Outcome Date GA Lbr Len/2nd Weight Sex Delivery Anes PTL Lv  2 Current           1 Para 07/28/10 [redacted]w[redacted]d  8 lb 3 oz (3.714 kg) F Vag-Vacuum  N Y     YTK:[id number] except as detailed in HPI. Soc Hx: Pregnancy welcomed  Objective:   Filed Vitals:   02/28/15 1307  BP: 112/67  Pulse: 89  Temp: 98 F (36.7 C)  Resp: 20   General: Well nourished, well developed female in no acute distress.  Skin: Warm and dry.  Cardiovascular:Regular rate and rhythm. Respiratory: Clear to auscultation bilateral. Normal respiratory effort Abdomen: gravid no pain Neuro/Psych: Normal mood and affect.   Pelvic exam: is not limited by body habitus EGBUS: within normal limits Vagina: within normal limits and with normal mucosa blood in the vault Cervix: Cl/Th/Hi, Nitrazine Neg Uterus: No contractions observed for >60 minutes.  Adnexa: not  evaluated  EFM:FHR: 140 bpm, variability: moderate,  accelerations:  Present,  decelerations:  Absent Toco: None  Assessment & Plan:   24 y.o. G2P1 @ [redacted]w[redacted]d, Admitted on 02/28/2015:UTI (See UA) Macrobid No SROM, No labor F/U [facility]   Discharge Home and Fetal Wellbeing Reassuring

## 2015-02-28 NOTE — Discharge Instructions (Signed)
Take medication as prescribed. Keep follow up appointment next week at  [facility]

## 2015-02-28 NOTE — Discharge Planning (Signed)
Pt given prescription per MD, discharge instructions and labor precautions discussed

## 2015-03-12 ENCOUNTER — Observation Stay
Admission: EM | Admit: 2015-03-12 | Discharge: 2015-03-12 | Disposition: A | Discharge status: Home / Self Care | Payer: Medicaid Other | Attending: Obstetrics and Gynecology | Admitting: Obstetrics and Gynecology

## 2015-03-12 DIAGNOSIS — O368130 Decreased fetal movements, third trimester, not applicable or unspecified: Principal | ICD-10-CM | POA: Insufficient documentation

## 2015-03-12 DIAGNOSIS — Z3A40 40 weeks gestation of pregnancy: Secondary | ICD-10-CM | POA: Insufficient documentation

## 2015-03-12 NOTE — Discharge Instructions (Signed)
Discharge instructions, both oral and written, given to pt and family per MD and RN.  Agree with plan of care to come in this week for IIOL on 9/28 at 2000.  Pt leaving department in stable condition ambulatory with family.  Lenore Cordia RNC

## 2015-03-12 NOTE — H&P (Signed)
Obstetric H&P   Chief Complaint: Decreased fetal movement  Prenatal Care Toni Carter: [facility]  History of Present Illness: 24 y.o. G2P1 66w0dby 03/12/2015, by 7 week [place] with decreased fetal movement today.  Has felt movement but less than usual.  Also inquiring about induction.  No LOF, no VB, irregular contractions.   ABO, Rh: AB/Positive/-- (02/04 0000)  Antibody: Negative (02/04 0000)  Rubella: MMR x 2 Varicella: Immune RPR: Nonreactive (02/04 0000)  HBsAg: Negative (02/04 0000)  HIV: Non-reactive (02/04 0000)  RPR: Nonreactive (02/04 0000) 1-hr: 111 GBS: negative 02/12/2015  TDAP declined 12/15/2014   Review of Systems: 10 point review of systems negative unless otherwise noted in HPI  Past Medical History: Past Medical History  Diagnosis Date  . Headache     Past Surgical History: Past Surgical History  Procedure Laterality Date  . No past surgeries      Past Obstetric History: G2P1001 VAVD x 1  8lbs 3oz female  Family History: No family history on file.  Social History: Social History   Social History  . Marital Status: Legally Separated    Spouse Name: N/A  . Number of Children: N/A  . Years of Education: N/A   Occupational History  . Not on file.   Social History Main Topics  . Smoking status: Never Smoker   . Smokeless tobacco: Never Used  . Alcohol Use: No  . Drug Use: No  . Sexual Activity: Yes   Other Topics Concern  . Not on file   Social History Narrative    Medications: Prior to Admission medications   Medication Sig Start Date End Date Taking? Authorizing Jomayra Novitsky  nitrofurantoin, macrocrystal-monohydrate, (MACROBID) 100 MG capsule Take 1 capsule (100 mg total) by mouth 2 (two) times daily. 02/28/15   RGae Dry MD  Prenatal Vit-Fe Fumarate-FA (PRENATAL MULTIVITAMIN) TABS tablet Take 1 tablet by mouth daily at 12 noon.    Historical Tobi Groesbeck, MD    Allergies: Allergies  Allergen Reactions  . Morphine And  Related Shortness Of Breath  . Tape Hives    Physical Exam: Vitals: Blood pressure 125/66, pulse 73, temperature 98.8 F (37.1 C), temperature source Oral, height 5' 10"  (1.778 m), weight 106.142 kg (234 lb).  Urine Dip Protein: N/A  FHT: 135, moderate, positive accels, no decels Toco: none  General: NAD HEENT: normocephalic, anicteic Pulmonary: CTAB Cardiovascular: RRR Abdomen: Gravid,  Non-tender Leopolds: vtx Genitourinary: 1cm per nursing staff Extremities: no edema  Labs: No results found for this or any previous visit (from the past 24 hour(s)).  Assessment: 24y.o. G2P1 448w0dy 03/12/2015, with decreased fetal movement  Plan: 1) Decreased fetal movement - reactive NST  2) Fetus - cat I tracing   5) Disposition - home, post-dates IOL scheduled for 4/28 at 4163w0d

## 2015-03-12 NOTE — Progress Notes (Signed)
G 2 P1001  EDC 03/12/2015  [facility] with complaint of decreased fetal movement since yesterday.  Ten movements in day yesterday.  Pt states she is experiencing back pain with this pregnancy and is hoping to stay to deliver this baby as this is her due date.  Pt is placed on EFM and plan of care for nst is discussed. Pt denies active bleeding and states she has had leaking entire pregnancy but has been checked for fluid and does not know what it is.  Pt agrees with plan of care.  Lenore Cordia RNC

## 2015-03-19 ENCOUNTER — Inpatient Hospital Stay
Admission: EM | Admit: 2015-03-19 | Discharge: 2015-03-22 | DRG: 767 | Disposition: A | Discharge status: Home / Self Care | Payer: Medicaid Other | Attending: Advanced Practice Midwife | Admitting: Advanced Practice Midwife

## 2015-03-19 DIAGNOSIS — K429 Umbilical hernia without obstruction or gangrene: Secondary | ICD-10-CM | POA: Diagnosis present

## 2015-03-19 DIAGNOSIS — Z91048 Other nonmedicinal substance allergy status: Secondary | ICD-10-CM

## 2015-03-19 DIAGNOSIS — O3413 Maternal care for benign tumor of corpus uteri, third trimester: Secondary | ICD-10-CM | POA: Diagnosis present

## 2015-03-19 DIAGNOSIS — D251 Intramural leiomyoma of uterus: Secondary | ICD-10-CM | POA: Diagnosis present

## 2015-03-19 DIAGNOSIS — Z885 Allergy status to narcotic agent status: Secondary | ICD-10-CM | POA: Diagnosis not present

## 2015-03-19 DIAGNOSIS — Z3A41 41 weeks gestation of pregnancy: Secondary | ICD-10-CM

## 2015-03-19 DIAGNOSIS — O480 Post-term pregnancy: Secondary | ICD-10-CM | POA: Diagnosis present

## 2015-03-19 DIAGNOSIS — Z302 Encounter for sterilization: Secondary | ICD-10-CM

## 2015-03-19 DIAGNOSIS — R112 Nausea with vomiting, unspecified: Secondary | ICD-10-CM | POA: Diagnosis not present

## 2015-03-19 DIAGNOSIS — Z3490 Encounter for supervision of normal pregnancy, unspecified, unspecified trimester: Secondary | ICD-10-CM

## 2015-03-19 LAB — TYPE AND SCREEN
ABO/RH(D): AB POS
ANTIBODY SCREEN: NEGATIVE

## 2015-03-19 LAB — CBC
HCT: 36 % (ref 35.0–47.0)
Hemoglobin: 12.5 g/dL (ref 12.0–16.0)
MCH: 29.2 pg (ref 26.0–34.0)
MCHC: 34.8 g/dL (ref 32.0–36.0)
MCV: 84.1 fL (ref 80.0–100.0)
PLATELETS: 128 10*3/uL — AB (ref 150–440)
RBC: 4.28 MIL/uL (ref 3.80–5.20)
RDW: 14.9 % — ABNORMAL HIGH (ref 11.5–14.5)
WBC: 10.6 10*3/uL (ref 3.6–11.0)

## 2015-03-19 LAB — ABO/RH: ABO/RH(D): AB POS

## 2015-03-19 MED ORDER — OXYTOCIN 40 UNITS IN LACTATED RINGERS INFUSION - SIMPLE MED: 62.5000 mL/h | INTRAVENOUS | Status: DC

## 2015-03-19 MED ORDER — OXYTOCIN BOLUS FROM INFUSION: 500.0000 mL | INTRAVENOUS | Status: DC

## 2015-03-19 MED ORDER — BUTORPHANOL TARTRATE 1 MG/ML IJ SOLN
2.0000 mg | Freq: Once | INTRAMUSCULAR | Status: DC | PRN
  Administered 2015-03-20: 2 mg via INTRAVENOUS
  Filled 2015-03-19: qty 2

## 2015-03-19 MED ORDER — LACTATED RINGERS IV SOLN
500.0000 mL | INTRAVENOUS | Status: DC | PRN
Start: 2015-03-19 — End: 2015-03-20

## 2015-03-19 MED ORDER — ACETAMINOPHEN 325 MG PO TABS: 650.0000 mg | ORAL_TABLET | ORAL | Status: DC | PRN

## 2015-03-19 MED ORDER — ACETAMINOPHEN 325 MG PO TABS
650.0000 mg | ORAL_TABLET | ORAL | Status: DC | PRN
  Administered 2015-03-20: 650 mg via ORAL
  Filled 2015-03-19: qty 2

## 2015-03-19 MED ORDER — OXYTOCIN 10 UNIT/ML IJ SOLN
INTRAMUSCULAR | Status: AC
  Filled 2015-03-19: qty 2

## 2015-03-19 MED ORDER — LIDOCAINE HCL (PF) 1 % IJ SOLN
30.0000 mL | INTRAMUSCULAR | Status: DC | PRN
  Filled 2015-03-19: qty 30

## 2015-03-19 MED ORDER — OXYTOCIN 40 UNITS IN LACTATED RINGERS INFUSION - SIMPLE MED
INTRAVENOUS | Status: AC
  Administered 2015-03-19: 2 m[IU]/min via INTRAVENOUS
  Filled 2015-03-19: qty 1000

## 2015-03-19 MED ORDER — AMMONIA AROMATIC IN INHA
RESPIRATORY_TRACT | Status: AC
  Filled 2015-03-19: qty 10

## 2015-03-19 MED ORDER — ONDANSETRON HCL 4 MG/2ML IJ SOLN
4.0000 mg | Freq: Four times a day (QID) | INTRAMUSCULAR | Status: DC | PRN
  Administered 2015-03-19: 4 mg via INTRAVENOUS
  Filled 2015-03-19: qty 2

## 2015-03-19 MED ORDER — TERBUTALINE SULFATE 1 MG/ML IJ SOLN: 0.2500 mg | Freq: Once | INTRAMUSCULAR | Status: DC | PRN

## 2015-03-19 MED ORDER — OXYTOCIN 40 UNITS IN LACTATED RINGERS INFUSION - SIMPLE MED
1.0000 m[IU]/min | INTRAVENOUS | Status: DC
  Administered 2015-03-19: 2 m[IU]/min via INTRAVENOUS

## 2015-03-19 MED ORDER — CITRIC ACID-SODIUM CITRATE 334-500 MG/5ML PO SOLN: 30.0000 mL | ORAL | Status: DC | PRN

## 2015-03-19 MED ORDER — MISOPROSTOL 200 MCG PO TABS
ORAL_TABLET | ORAL | Status: AC
Start: 2015-03-19 — End: 2015-03-20
  Filled 2015-03-19: qty 4

## 2015-03-19 MED ORDER — LACTATED RINGERS IV SOLN
INTRAVENOUS | Status: DC
  Administered 2015-03-20: 125 mL/h via INTRAVENOUS

## 2015-03-19 MED ORDER — LACTATED RINGERS IV SOLN: 500.0000 mL | INTRAVENOUS | Status: DC | PRN

## 2015-03-19 MED ORDER — LACTATED RINGERS IV SOLN
INTRAVENOUS | Status: DC
Start: 2015-03-19 — End: 2015-03-20
  Administered 2015-03-20: 125 mL/h via INTRAVENOUS

## 2015-03-19 NOTE — H&P (Signed)
Obstetric H&P   Chief Complaint: IOL at 41 wks  Prenatal Care Provider: [facility]  History of Present Illness: 24 y.o. G2P1 17w0dby 03/12/2015, by 7 week [place] to L&D for postdates IOL. No LOF, no VB, irregular contractions. +FM.   ABO, Rh: AB Positive Antibody: Negative  Rubella: MMR x 2 Varicella: Immune RPR: Nonreactive HBsAg: Negative   HIV: Non-reactive  RPR: Nonreactive  1-hr: 111 GBS: negative 02/12/2015  TDAP declined 12/15/2014    Past Medical History: Past Medical History  Diagnosis Date  . Headache     Past Surgical History: Past Surgical History  Procedure Laterality Date  . No past surgeries      Past Obstetric History: G2P1001 VAVD x 1  8lbs 3oz female (Vaccum applied for FITL)   Social History: Social History   Social History  . Marital Status: Legally Separated    Spouse Name: N/A  . Number of Children: N/A  . Years of Education: N/A   Occupational History  . Not on file.   Social History Main Topics  . Smoking status: Never Smoker   . Smokeless tobacco: Never Used  . Alcohol Use: No  . Drug Use: No  . Sexual Activity: Yes   Other Topics Concern  . Not on file   Social History Narrative    Medications: Prior to Admission medications   Medication Sig Start Date End Date Taking? Authorizing Provider  Prenatal Vit-Fe Fumarate-FA (PRENATAL MULTIVITAMIN) TABS tablet Take 1 tablet by mouth daily at 12 noon.    Historical Provider, MD    Allergies: Allergies  Allergen Reactions  . Morphine And Related Shortness Of Breath  . Tape Hives    Physical Exam: Vitals: Blood pressure  temperature source Oral, height _0  (1.778 m), weight 106.142 kg (234 lb).   FHT: Category 1, baseline 150, moderate variability, + accels, no decels  Toco: no contractions  General: NAD Pulmonary: CTAB Cardiovascular: RRR Abdomen: Gravid,  Non-tender Leopolds: vtx, 8 lbs Pelvic: 2/60/-3, posterior, soft Extremities: no edema  Labs: No  results found for this or any previous visit (from the past 24 hour(s)).  Assessment: 24y.o. G2P1 at 41 wks, for postdates IOL  Plan: Discussed options for IOL (cervidil vs Pitocin). Pt reports Pitocin seemed to work quickly with last delivery, opts for Pitocin tonight. Reviewed risks of IOL including FITL, tachysystole, inadequate labor or need for c-section for any above. Pt in agreement with POM.

## 2015-03-20 ENCOUNTER — Encounter: Payer: Self-pay | Admitting: Anesthesiology

## 2015-03-20 ENCOUNTER — Inpatient Hospital Stay: Payer: Medicaid Other | Admitting: Anesthesiology

## 2015-03-20 LAB — CHLAMYDIA/NGC RT PCR (ARMC ONLY)
Chlamydia Tr: NOT DETECTED
N gonorrhoeae: NOT DETECTED

## 2015-03-20 MED ORDER — KETOROLAC TROMETHAMINE 30 MG/ML IJ SOLN: 30.0000 mg | Freq: Four times a day (QID) | INTRAMUSCULAR | Status: DC | PRN

## 2015-03-20 MED ORDER — FENTANYL 2.5 MCG/ML W/ROPIVACAINE 0.2% IN NS 100 ML EPIDURAL INFUSION (ARMC-ANES)
EPIDURAL | Status: AC
  Administered 2015-03-20: 10 mL/h via EPIDURAL
  Filled 2015-03-20: qty 100

## 2015-03-20 MED ORDER — BENZOCAINE-MENTHOL 20-0.5 % EX AERO: 1.0000 "application " | INHALATION_SPRAY | CUTANEOUS | Status: DC | PRN

## 2015-03-20 MED ORDER — ONDANSETRON HCL 4 MG PO TABS
4.0000 mg | ORAL_TABLET | ORAL | Status: DC | PRN
  Administered 2015-03-21: 4 mg via ORAL
  Filled 2015-03-20: qty 1

## 2015-03-20 MED ORDER — MEPERIDINE HCL 25 MG/ML IJ SOLN: 6.2500 mg | INTRAMUSCULAR | Status: DC | PRN

## 2015-03-20 MED ORDER — HYDROCORTISONE 1 % EX CREA
TOPICAL_CREAM | CUTANEOUS | Status: DC | PRN
  Administered 2015-03-20: via TOPICAL
  Filled 2015-03-20: qty 28

## 2015-03-20 MED ORDER — PRENATAL MULTIVITAMIN CH
1.0000 | ORAL_TABLET | Freq: Every day | ORAL | Status: DC
  Administered 2015-03-20 – 2015-03-22 (×2): 1 via ORAL
  Filled 2015-03-20 (×2): qty 1

## 2015-03-20 MED ORDER — BUPIVACAINE HCL (PF) 0.25 % IJ SOLN
INTRAMUSCULAR | Status: DC | PRN
  Administered 2015-03-20 (×2): 2.5 mL via EPIDURAL

## 2015-03-20 MED ORDER — DOCUSATE SODIUM 100 MG PO CAPS
100.0000 mg | ORAL_CAPSULE | Freq: Every day | ORAL | Status: DC
  Administered 2015-03-20 – 2015-03-22 (×2): 100 mg via ORAL
  Filled 2015-03-20 (×2): qty 1

## 2015-03-20 MED ORDER — FERROUS SULFATE 325 (65 FE) MG PO TABS
325.0000 mg | ORAL_TABLET | Freq: Every day | ORAL | Status: DC
  Administered 2015-03-22: 325 mg via ORAL
  Filled 2015-03-20: qty 1

## 2015-03-20 MED ORDER — FENTANYL 2.5 MCG/ML W/ROPIVACAINE 0.2% IN NS 100 ML EPIDURAL INFUSION (ARMC-ANES): 10.0000 mL/h | EPIDURAL | Status: DC

## 2015-03-20 MED ORDER — ONDANSETRON HCL 4 MG/2ML IJ SOLN: 4.0000 mg | Freq: Three times a day (TID) | INTRAMUSCULAR | Status: DC | PRN

## 2015-03-20 MED ORDER — DIPHENHYDRAMINE HCL 25 MG PO CAPS: 25.0000 mg | ORAL_CAPSULE | ORAL | Status: DC | PRN

## 2015-03-20 MED ORDER — LIDOCAINE-EPINEPHRINE (PF) 1.5 %-1:200000 IJ SOLN
INTRAMUSCULAR | Status: DC | PRN
  Administered 2015-03-20: 3 mL via PERINEURAL

## 2015-03-20 MED ORDER — OXYTOCIN 40 UNITS IN LACTATED RINGERS INFUSION - SIMPLE MED: 62.5000 mL/h | INTRAVENOUS | Status: DC | PRN

## 2015-03-20 MED ORDER — WITCH HAZEL-GLYCERIN EX PADS: 1.0000 "application " | MEDICATED_PAD | CUTANEOUS | Status: DC | PRN

## 2015-03-20 MED ORDER — ONDANSETRON HCL 4 MG/2ML IJ SOLN
4.0000 mg | INTRAMUSCULAR | Status: DC | PRN
  Administered 2015-03-21: 4 mg via INTRAVENOUS
  Filled 2015-03-20: qty 2

## 2015-03-20 MED ORDER — DIPHENHYDRAMINE HCL 50 MG/ML IJ SOLN
12.5000 mg | INTRAMUSCULAR | Status: DC | PRN
Start: 2015-03-20 — End: 2015-03-20

## 2015-03-20 MED ORDER — DIBUCAINE 1 % RE OINT: 1.0000 "application " | TOPICAL_OINTMENT | RECTAL | Status: DC | PRN

## 2015-03-20 MED ORDER — NALOXONE HCL 1 MG/ML IJ SOLN
1.0000 ug/kg/h | INTRAVENOUS | Status: DC | PRN
  Filled 2015-03-20: qty 2

## 2015-03-20 MED ORDER — TRAMADOL HCL 50 MG PO TABS
50.0000 mg | ORAL_TABLET | Freq: Four times a day (QID) | ORAL | Status: DC | PRN
  Administered 2015-03-20: 50 mg via ORAL
  Filled 2015-03-20: qty 1

## 2015-03-20 MED ORDER — SIMETHICONE 80 MG PO CHEW: 80.0000 mg | CHEWABLE_TABLET | ORAL | Status: DC | PRN

## 2015-03-20 MED ORDER — NALOXONE HCL 0.4 MG/ML IJ SOLN: 0.4000 mg | INTRAMUSCULAR | Status: DC | PRN

## 2015-03-20 MED ORDER — SODIUM CHLORIDE 0.9 % IJ SOLN: 3.0000 mL | INTRAMUSCULAR | Status: DC | PRN

## 2015-03-20 MED ORDER — DIPHENHYDRAMINE HCL 25 MG PO CAPS
25.0000 mg | ORAL_CAPSULE | ORAL | Status: DC | PRN
Start: 2015-03-20 — End: 2015-03-20

## 2015-03-20 MED ORDER — IBUPROFEN 600 MG PO TABS
600.0000 mg | ORAL_TABLET | Freq: Four times a day (QID) | ORAL | Status: DC
  Administered 2015-03-20 – 2015-03-21 (×3): 600 mg via ORAL
  Filled 2015-03-20 (×3): qty 1

## 2015-03-20 NOTE — Progress Notes (Signed)
L&D Note  03/20/2015 - 7:54 AM  24 y.o. G2P1 [redacted]w[redacted]d   [name] is admitted for IOL for postdates   Subjective:  Comfortable with epidural, no sensation of pressure  Objective:   Filed Vitals:   03/20/15 0602 03/20/15 0618 03/20/15 0633 03/20/15 0700  BP: 128/80 128/70 123/76   Pulse: 82 84 87   Temp:    97.9 F (36.6 C)  TempSrc:    Oral  Resp:      Height:      Weight:        Current Vital Signs 24h Vital Sign Ranges  T 97.9 F (36.6 C) Temp  Avg: 98.4 F (36.9 C)  Min: 97.9 F (36.6 C)  Max: 98.8 F (37.1 C)  BP 123/76 mmHg BP  Min: 86/56  Max: 139/83  HR 87 Pulse  Avg: 76.9  Min: 62  Max: 92  RR 18 Resp  Avg: 18  Min: 18  Max: 18  SaO2     No Data Recorded       24 Hour I/O Current Shift I/O  Time Ins Outs 09/28 0701 - 09/29 0700 In: -  Out: 250 [Urine:250]      FHR: baseline 140, mod variability, + accels, occasional early/variables Toco: q 2-3 min SVE: 7/90/-1 to -2, ? OP position   Assessment :  IUP at [redacted]w[redacted]d, active labor    Plan:  Continue current plan. Peanut ball/position changes to help rotate fetal position  Burlene Arnt, [place]

## 2015-03-20 NOTE — Progress Notes (Signed)
Test dose     3cc 1 1/2 % Lido with epi

## 2015-03-20 NOTE — Progress Notes (Signed)
Epidural pump started at 10

## 2015-03-20 NOTE — Progress Notes (Signed)
Pt sitting up at bedside in position for epidural

## 2015-03-20 NOTE — Lactation Note (Signed)
This note was copied from the chart of Toni Carter. Lactation Consultation Note  Patient Name: Toni Carter Today's Date: 03/20/2015     Maternal Data  MOM requests info on drying up breast milk, states she is leaking milk and has no desire to breastfeed, attempted with first child, encouraged tight bra and no stimulation, given treatment for engorgement Feeding Feeding Type: Bottle Fed - Formula Nipple Type: Regular  [facility] Score/Interventions                      Lactation Tools Discussed/Used     Consult Status      Ferol Luz 03/20/2015, 7:41 PM

## 2015-03-20 NOTE — Progress Notes (Signed)
FHR with variables down to the 90's with slow return to baseline.   X2      O2 by nonbreather mask on. Pt tilted to left side.   FHR 130's

## 2015-03-20 NOTE — Progress Notes (Addendum)
Bolus   .125% Bupvicaine

## 2015-03-20 NOTE — Anesthesia Procedure Notes (Signed)
Epidural  Start time: 03/20/2015 2:55 AM End time: 03/20/2015 3:06 AM  Staffing Anesthesiologist: Martha Clan Performed by: anesthesiologist   Preanesthetic Checklist Completed: patient identified, site marked, surgical consent, pre-op evaluation, timeout performed, IV checked, risks and benefits discussed and monitors and equipment checked  Epidural Patient position: sitting Prep: ChloraPrep Patient monitoring: heart rate, cardiac monitor, continuous pulse ox and blood pressure Approach: midline Location: L3-L4 Injection technique: LOR saline  Needle:  Needle type: Tuohy  Needle gauge: 18 G Needle length: 9 cm Needle insertion depth: 7 cm Catheter type: closed end Catheter size: 20 Guage Catheter at skin depth: 12 cm Test dose: negative  Additional Notes Reason for block:procedure for pain

## 2015-03-20 NOTE — Anesthesia Preprocedure Evaluation (Addendum)
Anesthesia Evaluation  Patient identified by MRN, date of birth, ID band Patient awake    Reviewed: Allergy & Precautions, H&P , NPO status , Patient's Chart, lab work & pertinent test results, reviewed documented beta blocker date and time   History of Anesthesia Complications Negative for: history of anesthetic complications  Airway Mallampati: III  TM Distance: >3 FB Neck ROM: full    Dental no notable dental hx. (+) Teeth Intact   Pulmonary neg pulmonary ROS,    Pulmonary exam normal breath sounds clear to auscultation       Cardiovascular Exercise Tolerance: Good negative cardio ROS Normal cardiovascular exam Rhythm:regular Rate:Normal     Neuro/Psych negative neurological ROS  negative psych ROS   GI/Hepatic negative GI ROS, Neg liver ROS,   Endo/Other  negative endocrine ROS  Renal/GU negative Renal ROS  negative genitourinary   Musculoskeletal   Abdominal   Peds  Hematology negative hematology ROS (+)   Anesthesia Other Findings Past Medical History:   Headache                                                     Reproductive/Obstetrics (+) Pregnancy                            Anesthesia Physical Anesthesia Plan  ASA: II  Anesthesia Plan: Epidural   Post-op Pain Management:    Induction:   Airway Management Planned:   Additional Equipment:   Intra-op Plan:   Post-operative Plan:   Informed Consent: I have reviewed the patients History and Physical, chart, labs and discussed the procedure including the risks, benefits and alternatives for the proposed anesthesia with the patient or authorized representative who has indicated his/her understanding and acceptance.   Dental Advisory Given  Plan Discussed with: Anesthesiologist, CRNA and Surgeon  Anesthesia Plan Comments:        Anesthesia Quick Evaluation

## 2015-03-20 NOTE — Progress Notes (Signed)
L&D Note  03/20/2015 - 5:40 AM  24 y.o. G2P1 [redacted]w[redacted]d   [name] is admitted for IOL for postdates   Subjective:  Comfortable after epidural  Objective:   Filed Vitals:   03/20/15 0448 03/20/15 0503 03/20/15 0518 03/20/15 0533  BP: 120/59 111/58 115/57 128/74  Pulse: 74 70 68 84  Temp:    97.9 F (36.6 C)  TempSrc:    Oral  Resp:    18  Height:      Weight:        Current Vital Signs 24h Vital Sign Ranges  T 97.9 F (36.6 C) Temp  Avg: 98.5 F (36.9 C)  Min: 97.9 F (36.6 C)  Max: 98.8 F (37.1 C)  BP 128/74 mmHg BP  Min: 86/56  Max: 139/83  HR 84 Pulse  Avg: 75.9  Min: 62  Max: 92  RR 18 Resp  Avg: 18  Min: 18  Max: 18  SaO2     No Data Recorded       24 Hour I/O Current Shift I/O  Time Ins Outs        FHR: baseline 135-140, mod variability, + accels, occasional variables Toco: q 2 min SVE: 4-5/80/-2   Assessment :  IUP at [redacted]w[redacted]d, labor    Plan:  AROM with clear fluid noted Anticipate vaginal delivery  Toni Carter, [place]

## 2015-03-20 NOTE — Progress Notes (Signed)
Catheter placed

## 2015-03-21 ENCOUNTER — Inpatient Hospital Stay: Payer: Medicaid Other | Admitting: Anesthesiology

## 2015-03-21 ENCOUNTER — Encounter: Payer: Self-pay | Admitting: Anesthesiology

## 2015-03-21 ENCOUNTER — Encounter
Admission: EM | Disposition: A | Discharge status: Home / Self Care | Payer: Self-pay | Source: Home / Self Care | Attending: Advanced Practice Midwife

## 2015-03-21 HISTORY — PX: TUBAL LIGATION: SHX77

## 2015-03-21 LAB — CBC
HEMATOCRIT: 33.2 % — AB (ref 35.0–47.0)
HEMOGLOBIN: 11.1 g/dL — AB (ref 12.0–16.0)
MCH: 28.3 pg (ref 26.0–34.0)
MCHC: 33.5 g/dL (ref 32.0–36.0)
MCV: 84.5 fL (ref 80.0–100.0)
Platelets: 115 10*3/uL — ABNORMAL LOW (ref 150–440)
RBC: 3.93 MIL/uL (ref 3.80–5.20)
RDW: 14.8 % — ABNORMAL HIGH (ref 11.5–14.5)
WBC: 11.6 10*3/uL — ABNORMAL HIGH (ref 3.6–11.0)

## 2015-03-21 LAB — RPR: RPR: NONREACTIVE

## 2015-03-21 SURGERY — LIGATION, FALLOPIAN TUBE, POSTPARTUM
Anesthesia: General | Laterality: Bilateral | Wound class: Clean Contaminated

## 2015-03-21 MED ORDER — DEXTROSE-NACL 5-0.45 % IV SOLN
INTRAVENOUS | Status: DC
  Administered 2015-03-21: 23:00:00 via INTRAVENOUS
  Administered 2015-03-22: 125 mL/h via INTRAVENOUS

## 2015-03-21 MED ORDER — GLYCOPYRROLATE 0.2 MG/ML IJ SOLN
INTRAMUSCULAR | Status: DC | PRN
  Administered 2015-03-21: 0.4 mg via INTRAVENOUS

## 2015-03-21 MED ORDER — PROMETHAZINE HCL 25 MG/ML IJ SOLN
25.0000 mg | Freq: Four times a day (QID) | INTRAMUSCULAR | Status: DC | PRN
  Administered 2015-03-21: 25 mg via INTRAMUSCULAR
  Filled 2015-03-21: qty 1

## 2015-03-21 MED ORDER — SCOPOLAMINE 1 MG/3DAYS TD PT72
1.0000 | MEDICATED_PATCH | TRANSDERMAL | Status: DC
Start: 2015-03-21 — End: 2015-03-22
  Administered 2015-03-21: 1.5 mg via TRANSDERMAL
  Filled 2015-03-21: qty 1

## 2015-03-21 MED ORDER — PROPOFOL 10 MG/ML IV BOLUS
INTRAVENOUS | Status: DC | PRN
  Administered 2015-03-21: 200 mg via INTRAVENOUS

## 2015-03-21 MED ORDER — FENTANYL CITRATE (PF) 100 MCG/2ML IJ SOLN
INTRAMUSCULAR | Status: DC | PRN
  Administered 2015-03-21: 50 ug via INTRAVENOUS
  Administered 2015-03-21: 100 ug via INTRAVENOUS
  Administered 2015-03-21: 50 ug via INTRAVENOUS

## 2015-03-21 MED ORDER — MIDAZOLAM HCL 2 MG/2ML IJ SOLN
INTRAMUSCULAR | Status: DC | PRN
  Administered 2015-03-21: 2 mg via INTRAVENOUS

## 2015-03-21 MED ORDER — ROCURONIUM BROMIDE 100 MG/10ML IV SOLN
INTRAVENOUS | Status: DC | PRN
  Administered 2015-03-21: 5 mg via INTRAVENOUS

## 2015-03-21 MED ORDER — KETOROLAC TROMETHAMINE 30 MG/ML IJ SOLN
30.0000 mg | Freq: Four times a day (QID) | INTRAMUSCULAR | Status: DC | PRN
  Administered 2015-03-22 (×2): 30 mg via INTRAMUSCULAR
  Filled 2015-03-21 (×2): qty 1

## 2015-03-21 MED ORDER — SUCCINYLCHOLINE CHLORIDE 20 MG/ML IJ SOLN
INTRAMUSCULAR | Status: DC | PRN
  Administered 2015-03-21: 110 mg via INTRAVENOUS

## 2015-03-21 MED ORDER — HYDROMORPHONE HCL 1 MG/ML IJ SOLN
1.0000 mg | INTRAMUSCULAR | Status: DC | PRN
  Administered 2015-03-21 – 2015-03-22 (×2): 1 mg via INTRAVENOUS
  Filled 2015-03-21 (×2): qty 1

## 2015-03-21 MED ORDER — HYDROCODONE-ACETAMINOPHEN 5-325 MG PO TABS
1.0000 | ORAL_TABLET | ORAL | Status: DC | PRN
  Administered 2015-03-21 (×2): 2 via ORAL
  Filled 2015-03-21: qty 2

## 2015-03-21 MED ORDER — HYDROCODONE-ACETAMINOPHEN 5-325 MG PO TABS
ORAL_TABLET | ORAL | Status: AC
  Administered 2015-03-21: 2 via ORAL
  Filled 2015-03-21: qty 2

## 2015-03-21 MED ORDER — ONDANSETRON HCL 4 MG/2ML IJ SOLN
INTRAMUSCULAR | Status: DC | PRN
  Administered 2015-03-21: 4 mg via INTRAVENOUS

## 2015-03-21 MED ORDER — LACTATED RINGERS IV SOLN
INTRAVENOUS | Status: DC | PRN
  Administered 2015-03-21: 15:00:00 via INTRAVENOUS

## 2015-03-21 MED ORDER — NEOSTIGMINE METHYLSULFATE 10 MG/10ML IV SOLN
INTRAVENOUS | Status: DC | PRN
  Administered 2015-03-21: 3 mg via INTRAVENOUS

## 2015-03-21 MED ORDER — FENTANYL CITRATE (PF) 100 MCG/2ML IJ SOLN
25.0000 ug | INTRAMUSCULAR | Status: DC | PRN
  Administered 2015-03-21 (×3): 50 ug via INTRAVENOUS

## 2015-03-21 MED ORDER — OXYCODONE-ACETAMINOPHEN 5-325 MG PO TABS: 1.0000 | ORAL_TABLET | ORAL | Status: DC | PRN

## 2015-03-21 MED ORDER — PROMETHAZINE HCL 25 MG/ML IJ SOLN: 6.2500 mg | INTRAMUSCULAR | Status: DC | PRN

## 2015-03-21 SURGICAL SUPPLY — 25 items
CHLORAPREP W/TINT 26ML (MISCELLANEOUS) ×3 IMPLANT
DRAPE LAPAROTOMY 100X77 ABD (DRAPES) ×3 IMPLANT
DRESSING TELFA 4X3 1S ST N-ADH (GAUZE/BANDAGES/DRESSINGS) ×3 IMPLANT
DRSG TEGADERM 2-3/8X2-3/4 SM (GAUZE/BANDAGES/DRESSINGS) ×3 IMPLANT
GLOVE BIO SURGEON STRL SZ7 (GLOVE) ×3 IMPLANT
GLOVE INDICATOR 7.5 STRL GRN (GLOVE) ×9 IMPLANT
GOWN STRL REUS W/ TWL LRG LVL3 (GOWN DISPOSABLE) ×2 IMPLANT
GOWN STRL REUS W/TWL LRG LVL3 (GOWN DISPOSABLE) ×4
KIT RM TURNOVER STRD PROC AR (KITS) ×3 IMPLANT
LABEL OR SOLS (LABEL) IMPLANT
LIQUID BAND (GAUZE/BANDAGES/DRESSINGS) ×3 IMPLANT
NDL SAFETY 22GX1.5 (NEEDLE) ×3 IMPLANT
NS IRRIG 500ML POUR BTL (IV SOLUTION) ×3 IMPLANT
PACK BASIN MINOR ARMC (MISCELLANEOUS) ×3 IMPLANT
PAD GROUND ADULT SPLIT (MISCELLANEOUS) ×3 IMPLANT
SPONGE LAP 4X18 5PK (MISCELLANEOUS) ×3 IMPLANT
STRAP SAFETY BODY (MISCELLANEOUS) IMPLANT
SUT CHROMIC GUT BROWN 0 54 (SUTURE) IMPLANT
SUT CHROMIC GUT BROWN 0 54IN (SUTURE)
SUT MNCRL 4-0 (SUTURE) ×2
SUT MNCRL 4-0 27XMFL (SUTURE) ×1
SUT VIC AB 0 UR5 27 (SUTURE) ×3 IMPLANT
SUT VIC AB 2-0 UR6 27 (SUTURE) IMPLANT
SUTURE MNCRL 4-0 27XMF (SUTURE) ×1 IMPLANT
SYRINGE 10CC LL (SYRINGE) ×3 IMPLANT

## 2015-03-21 NOTE — Anesthesia Postprocedure Evaluation (Signed)
  Anesthesia Post-op Note  Patient: Toni Carter  Procedure(s) Performed: * No procedures listed *  Anesthesia type:Epidural  Patient location: 349  Post pain: Pain level controlled  Post assessment: Post-op Vital signs reviewed, Patient's Cardiovascular Status Stable, Respiratory Function Stable, Patent Airway and No signs of Nausea or vomiting  Post vital signs: Reviewed and stable  Last Vitals:  Filed Vitals:   03/21/15 0401  BP: 103/51  Pulse: 61  Temp: 36.8 C  Resp: 16    Level of consciousness: awake, alert  and patient cooperative  Complications: No apparent anesthesia complications

## 2015-03-21 NOTE — Anesthesia Preprocedure Evaluation (Signed)
Anesthesia Evaluation  Patient identified by MRN, date of birth, ID band Patient awake    Reviewed: Allergy & Precautions, H&P , NPO status , Patient's Chart, lab work & pertinent test results, reviewed documented beta blocker date and time   History of Anesthesia Complications Negative for: history of anesthetic complications  Airway Mallampati: III  TM Distance: >3 FB Neck ROM: full    Dental no notable dental hx. (+) Teeth Intact   Pulmonary neg pulmonary ROS,    Pulmonary exam normal breath sounds clear to auscultation       Cardiovascular Exercise Tolerance: Good negative cardio ROS Normal cardiovascular exam Rhythm:regular Rate:Normal     Neuro/Psych negative neurological ROS  negative psych ROS   GI/Hepatic negative GI ROS, Neg liver ROS,   Endo/Other  negative endocrine ROS  Renal/GU negative Renal ROS  negative genitourinary   Musculoskeletal   Abdominal   Peds  Hematology negative hematology ROS (+)   Anesthesia Other Findings Past Medical History:   Headache                                                     Reproductive/Obstetrics negative OB ROS                             Anesthesia Physical  Anesthesia Plan  ASA: I  Anesthesia Plan: General   Post-op Pain Management:    Induction:   Airway Management Planned:   Additional Equipment:   Intra-op Plan:   Post-operative Plan:   Informed Consent: I have reviewed the patients History and Physical, chart, labs and discussed the procedure including the risks, benefits and alternatives for the proposed anesthesia with the patient or authorized representative who has indicated his/her understanding and acceptance.   Dental Advisory Given  Plan Discussed with: Anesthesiologist, CRNA and Surgeon  Anesthesia Plan Comments:         Anesthesia Quick Evaluation

## 2015-03-21 NOTE — Op Note (Signed)
Preoperative Diagnosis: 1) 24 y.o.  [id number] desiring postpartum permanent surgical sterilization  Postoperative Diagnosis: 1) 24 y.o. [id number] desiring postpartum permanent surgical sterilization  Operation Performed: Bilateral tubal ligation via Pomeroy method  Indication: 24 y.o. [id number]  with undesired fertility, desires permanent sterilization.  Other reversible forms of contraception were discussed with patient; she declines all other modalities. Permanent nature of as well as associated risks of the procedure discussed with patient including but not limited to: risk of regret, permanence of method, bleeding, infection, injury to surrounding organs and need for additional procedures.  Failure risk of 0.5-1% with increased risk of ectopic gestation if pregnancy occurs was also discussed with patient.    Anesthesia: General  Preoperative Antibiotics: none  Estimated Blood Loss: minimal  IV Fluids: 39mL  Drains or Tubes: none  Implants: none  Specimens Removed: none  Complications: none  Intraoperative Findings: Normal tubes, ovaries, and uterus.  Slightly more prominent right cornual region with what appeared to be a intramural fibroid, small umbilical hernia  Patient Condition: stable  Procedure in Detail:  Patient was taken to the operating room where she was administered general anesthesia.  She was positioned in the supine position.  Time out was performed.  Attention was turned to the patient's abdomen.  The umbilicus was infiltrated with 1% Sensorcaine, before making a stab incision using an 11 blade scalpel, which was extended caudally.  The subcutaneous fat  Was dissected off the fascia.  The fascia was tented up with two alice clamps, incised using mayo scissors.  The table was tilted to the left and the right tube was identified walked out to its fimbriated end using babcock clamps.  The mid isthmic portion was then double suture ligated using a 0 chromic wheel.  The  intervening knuckle of tube was excised, noted to be hemostatic before returning to the abdomen.   The left tube was removed in a similar fashion.  The fascia was closed usin a 0 Vicryl in a running fashion.  Skin was closed using 4-0 monocryl.  Sponge needle and instrument counts were correct time two.  The patient tolerated the procedure well and was taken to the recovery room in stable condition.

## 2015-03-21 NOTE — Progress Notes (Signed)
  Subjective:  Doing well minimal lochia, no fevers, no chills.    Objective:   Blood pressure 97/55, pulse 60, temperature 97.7 F (36.5 C), temperature source Oral, resp. rate 18, height _0  (1.778 m), weight 106.595 kg (235 lb), SpO2 99 %, unknown if currently breastfeeding.  General: NAD Pulmonary: no increased work of breathing Abdomen: non-distended, non-tender, fundus firm at level of umbilicus Extremities: no edema, no erythema, no tenderness  Results for orders placed or performed during the hospital encounter of 03/19/15 (from the past 72 hour(s))  CBC     Status: Abnormal   Collection Time: 03/19/15  8:40 PM  Result Value Ref Range   WBC 10.6 3.6 - 11.0 K/uL   RBC 4.28 3.80 - 5.20 MIL/uL   Hemoglobin 12.5 12.0 - 16.0 g/dL   HCT 36.0 35.0 - 47.0 %   MCV 84.1 80.0 - 100.0 fL   MCH 29.2 26.0 - 34.0 pg   MCHC 34.8 32.0 - 36.0 g/dL   RDW 14.9 (H) 11.5 - 14.5 %   Platelets 128 (L) 150 - 440 K/uL  RPR     Status: None   Collection Time: 03/19/15  8:40 PM  Result Value Ref Range   RPR Ser Ql Non Reactive Non Reactive    Comment: (NOTE) Performed At: [facility] [place], [place] [id number] Lindon Romp MD [id number]   Type and screen     Status: None   Collection Time: 03/19/15  8:40 PM  Result Value Ref Range   ABO/RH(D) AB POS    Antibody Screen NEG    Sample Expiration 03/22/2015   ABO/Rh     Status: None   Collection Time: 03/19/15  8:41 PM  Result Value Ref Range   ABO/RH(D) AB POS   Chlamydia/NGC rt PCR ([facility] only)     Status: None   Collection Time: 03/20/15 12:01 AM  Result Value Ref Range   Specimen source GC/Chlam URINE, CLEAN CATCH    Chlamydia Tr NOT DETECTED NOT DETECTED   N gonorrhoeae NOT DETECTED NOT DETECTED    Comment: (NOTE) 100  This methodology has not been evaluated in pregnant women or in 200  patients with a history of hysterectomy. 300 400  This methodology will not be performed on patients less than  17  years of age.   CBC     Status: Abnormal   Collection Time: 03/21/15  5:50 AM  Result Value Ref Range   WBC 11.6 (H) 3.6 - 11.0 K/uL   RBC 3.93 3.80 - 5.20 MIL/uL   Hemoglobin 11.1 (L) 12.0 - 16.0 g/dL   HCT 33.2 (L) 35.0 - 47.0 %   MCV 84.5 80.0 - 100.0 fL   MCH 28.3 26.0 - 34.0 pg   MCHC 33.5 32.0 - 36.0 g/dL   RDW 14.8 (H) 11.5 - 14.5 %   Platelets 115 (L) 150 - 440 K/uL    Assessment:   24 y.o. G2P1002 postpartum day #1 desiring permanent surgical sterilization  Plan:   1) Patient NPO since midnight, fundus at umbilicus.  Proceed with postpartum BTL  2) AB pos / MMR x 2 / VZI  3) TDAP declined / influenza declined  4) Breast/desires BTL  5) Disposition anticipated PPD#2, POD#1 discharge

## 2015-03-21 NOTE — Anesthesia Procedure Notes (Signed)
Procedure Name: Intubation Performed by: Jennette Bill Pre-anesthesia Checklist: Patient identified, Patient being monitored, Timeout performed, Emergency Drugs available and Suction available Patient Re-evaluated:Patient Re-evaluated prior to inductionOxygen Delivery Method: Circle system utilized Preoxygenation: Pre-oxygenation with 100% oxygen Intubation Type: IV induction Ventilation: Mask ventilation without difficulty Laryngoscope Size: Mac and 3 Grade View: Grade I Tube type: Oral Tube size: 7.0 mm Number of attempts: 1 Airway Equipment and Method: Stylet Placement Confirmation: ETT inserted through vocal cords under direct vision,  positive ETCO2 and breath sounds checked- equal and bilateral Secured at: 21 cm Tube secured with: Tape Dental Injury: Teeth and Oropharynx as per pre-operative assessment

## 2015-03-21 NOTE — Transfer of Care (Signed)
Immediate Anesthesia Transfer of Care Note  Patient: Toni Carter  Procedure(s) Performed: Procedure(s): POST PARTUM TUBAL LIGATION (Bilateral)  Patient Location: PACU  Anesthesia Type:General  Level of Consciousness: awake, alert  and oriented  Airway & Oxygen Therapy: Patient Spontanous Breathing and Patient connected to face mask oxygen  Post-op Assessment: Report given to RN and Post -op Vital signs reviewed and stable  Post vital signs: Reviewed and stable  Last Vitals:  Filed Vitals:   03/21/15 1553  BP: 129/76  Pulse: 83  Temp: 36.2 C  Resp: 20    Complications: No apparent anesthesia complications

## 2015-03-21 NOTE — Progress Notes (Signed)
Obstetric and Gynecology  Subjective  Nausea postoperatively.  Pain initially managed with vicodin, reportedly had nausea issues with vicodin in the past.  Switched to percocet without improvement  Objective   Filed Vitals:   03/21/15 1920  BP: 127/70  Pulse: 63  Temp: 98.3 F (36.8 C)  Resp: 18     Intake/Output Summary (Last 24 hours) at 03/21/15 2303 Last data filed at 03/21/15 1634  Gross per 24 hour  Intake    740 ml  Output   1050 ml  Net   -310 ml    General: NAD Abdomen: NABS, soft, mildly distended, appropriately tender Extremities: no edema  Labs: Results for orders placed or performed during the hospital encounter of 03/19/15 (from the past 24 hour(s))  CBC     Status: Abnormal   Collection Time: 03/21/15  5:50 AM  Result Value Ref Range   WBC 11.6 (H) 3.6 - 11.0 K/uL   RBC 3.93 3.80 - 5.20 MIL/uL   Hemoglobin 11.1 (L) 12.0 - 16.0 g/dL   HCT 33.2 (L) 35.0 - 47.0 %   MCV 84.5 80.0 - 100.0 fL   MCH 28.3 26.0 - 34.0 pg   MCHC 33.5 32.0 - 36.0 g/dL   RDW 14.8 (H) 11.5 - 14.5 %   Platelets 115 (L) 150 - 440 K/uL    Cultures: Results for orders placed or performed during the hospital encounter of 03/19/15  OB RESULT CONSOLE Group B Strep     Status: None   Collection Time: 02/14/15 12:00 AM  Result Value Ref Range Status   GBS Negative  Final  Chlamydia/NGC rt PCR ([facility] only)     Status: None   Collection Time: 03/20/15 12:01 AM  Result Value Ref Range Status   Specimen source GC/Chlam URINE, CLEAN CATCH  Final   Chlamydia Tr NOT DETECTED NOT DETECTED Final   N gonorrhoeae NOT DETECTED NOT DETECTED Final    Comment: (NOTE) 100  This methodology has not been evaluated in pregnant women or in 200  patients with a history of hysterectomy. 300 400  This methodology will not be performed on patients less than 59  years of age.     Imaging:  Assessment   24 y.o. G2P1002 PPD#1 TSVD, POD#0 BTL  Plan  - NPO - IV dilaudid pushes prn pain and  toradol, requested scopolamine patch earlier and in place - D5 1/2 NS at 170mL/hr - CMP and CBC in AM

## 2015-03-22 LAB — CBC
HEMATOCRIT: 31.4 % — AB (ref 35.0–47.0)
HEMOGLOBIN: 10.5 g/dL — AB (ref 12.0–16.0)
MCH: 28 pg (ref 26.0–34.0)
MCHC: 33.3 g/dL (ref 32.0–36.0)
MCV: 84.2 fL (ref 80.0–100.0)
Platelets: 130 10*3/uL — ABNORMAL LOW (ref 150–440)
RBC: 3.73 MIL/uL — AB (ref 3.80–5.20)
RDW: 15.1 % — ABNORMAL HIGH (ref 11.5–14.5)
WBC: 11.7 10*3/uL — ABNORMAL HIGH (ref 3.6–11.0)

## 2015-03-22 LAB — COMPREHENSIVE METABOLIC PANEL
ALK PHOS: 92 U/L (ref 38–126)
ALT: 10 U/L — AB (ref 14–54)
ANION GAP: 7 (ref 5–15)
AST: 23 U/L (ref 15–41)
Albumin: 2.5 g/dL — ABNORMAL LOW (ref 3.5–5.0)
BILIRUBIN TOTAL: 0.3 mg/dL (ref 0.3–1.2)
BUN: 10 mg/dL (ref 6–20)
CALCIUM: 7.9 mg/dL — AB (ref 8.9–10.3)
CO2: 24 mmol/L (ref 22–32)
CREATININE: 0.55 mg/dL (ref 0.44–1.00)
Chloride: 107 mmol/L (ref 101–111)
GFR calc non Af Amer: >60 mL/min (ref >60)
GLUCOSE: 93 mg/dL (ref 65–99)
Potassium: 3.5 mmol/L (ref 3.5–5.1)
Sodium: 138 mmol/L (ref 135–145)
TOTAL PROTEIN: 5.2 g/dL — AB (ref 6.5–8.1)

## 2015-03-22 MED ORDER — OXYCODONE-ACETAMINOPHEN 5-325 MG PO TABS: 1.0000 | ORAL_TABLET | Freq: Four times a day (QID) | ORAL | Status: DC | PRN

## 2015-03-22 MED ORDER — IBUPROFEN 600 MG PO TABS: 600.0000 mg | ORAL_TABLET | Freq: Four times a day (QID) | ORAL | Status: DC | PRN

## 2015-03-22 NOTE — Discharge Instructions (Signed)
Vaginal Delivery, Care After Refer to this sheet in the next few weeks. These discharge instructions provide you with information on caring for yourself after delivery. Your caregiver may also give you specific instructions. Your treatment has been planned according to the most current medical practices available, but problems sometimes occur. Call your caregiver if you have any problems or questions after you go home. HOME CARE INSTRUCTIONS  Take over-the-counter or prescription medicines only as directed by your caregiver or pharmacist.  Do not drink alcohol, especially if you are breastfeeding or taking medicine to relieve pain.  Do not smoke tobacco.  Continue to use good perineal care. Good perineal care includes:  Wiping your perineum from back to front  Keeping your perineum clean.  You can do sitz baths twice a day, to help keep this area clean  Do not use tampons, douche or have sex for the next 6 weeks.  Shower only and avoid sitting in submerged water, aside from sitz baths, for the next 6 weeks.   Wear a well-fitting bra that provides breast support.  Eat healthy foods.  Drink enough fluids to keep your urine clear or pale yellow.  Eat high-fiber foods such as whole grain cereals and breads, brown rice, beans, and fresh fruits and vegetables every day. These foods may help prevent or relieve constipation.  Avoid constipation with high fiber foods or medications, such as miralax or metamucil  Follow your caregiver's recommendations regarding resumption of activities such as climbing stairs, driving, lifting, exercising, or traveling.  Try to have someone help you with your household activities and your newborn for at least a few days after you leave the hospital.  Rest as much as possible. Try to rest or take a nap when your newborn is sleeping.  Increase your activities gradually.  Keep all of your scheduled postpartum appointments. It is very important to keep  your scheduled follow-up appointments. At these appointments, your caregiver will be checking to make sure that you are healing physically and emotionally. SEEK MEDICAL CARE IF:   You are passing large clots from your vagina. Save any clots to show your caregiver.  You have a foul smelling discharge from your vagina.  Your incision is red, hot to touch, painful, swollen, or has foul smelling drainage.   You have trouble urinating.  You are urinating frequently.  You have pain when you urinate.  You have a change in your bowel movements.  You have painful, hard, or reddened breasts.  You have a severe headache.  You have blurred vision or see spots.  You feel sad or depressed.  You have thoughts of hurting yourself or your newborn.  You have questions about your care, the care of your newborn, or medicines.  You are dizzy or light-headed.  You have a rash.  You have nausea or vomiting.  You were breastfeeding and have not had a menstrual period within 12 weeks after you stopped breastfeeding.  You are not breastfeeding and have not had a menstrual period by the 12th week after delivery.  You have a fever. SEEK IMMEDIATE MEDICAL CARE IF:   You have persistent pain.  You have chest pain.  You have shortness of breath.  You faint.  You have leg pain.  You have stomach pain.  Your vaginal bleeding saturates two or more sanitary pads in 1 hour. MAKE SURE YOU:   Understand these instructions.  Will watch your condition.  Will get help right away if you are  not doing well or get worse. Document Released: 06/04/2000 Document Revised: 10/22/2013 Document Reviewed: 02/02/2012 [facility] Patient Information 2015 [place], [place]. This information is not intended to replace advice given to you by your health care provider. Make sure you discuss any questions you have with your health care provider.  Sitz Bath A sitz bath is a warm water bath taken in the sitting  position. The water covers only the hips and butt (buttocks). We recommend using one that fits in the toilet, to help with ease of use and cleanliness. It may be used for either healing or cleaning purposes. Sitz baths are also used to relieve pain, itching, or muscle tightening (spasms). The water may contain medicine. Moist heat will help you heal and relax.  HOME CARE  Take 3 to 4 sitz baths a day.  Fill the bathtub half-full with warm water.  Sit in the water and open the drain a little.  Turn on the warm water to keep the tub half-full. Keep the water running constantly.  Soak in the water for 15 to 20 minutes.  After the sitz bath, pat the affected area dry. GET HELP RIGHT AWAY IF: You get worse instead of better. Stop the sitz baths if you get worse. MAKE SURE YOU:  Understand these instructions.  Will watch your condition.  Will get help right away if you are not doing well or get worse. Document Released: 07/15/2004 Document Revised: 03/01/2012 Document Reviewed: 10/05/2010 [facility] Patient Information 2015 [place], [place]. This information is not intended to replace advice given to you by your health care provider. Make sure you discuss any questions you have with your health care provider.  Postpartum Tubal Ligation A postpartum tubal ligation (PPTL) is a procedure that blocks the fallopian tubes right after childbirth or 1-2 days after childbirth. PPTL is done before the uterus returns to its normal location. The procedure is also called a minilaparotomy. By blocking the fallopian tubes, the eggs that are released from the ovaries cannot enter the uterus and sperm cannot reach the egg. A PPTL is done so you will not be able to get pregnant or have a baby.  Although this procedure may be reversed, it should be considered permanent and irreversible. If you want to have future pregnancies, you should not have this procedure. LET YOUR CAREGIVER KNOW ABOUT:  Allergies to food or  medicine.  Medicines taken, including vitamins, herbs, eyedrops, over-the-counter medicines, and creams.  Use of steroids (by mouth or creams).  Previous problems with numbing medicines.  History of bleeding problems or blood clots.  Any recent colds or infections.  Previous surgery.  Other health problems, including diabetes and kidney problems. RISKS AND COMPLICATIONS  Infection.  Bleeding.  Injury to other organs.  Anesthetic side effects.  Failure of the procedure.  Ectopic pregnancy.  Future regret about having the procedure done. BEFORE THE PROCEDURE   You may need to sign certain permission forms with your insurance up to 30 days before your due date.  After delivering your baby, you cannot eat or drink anything if the procedure is performed the same day. If you are having the procedure a day after delivering, you may be able to eat and drink until midnight. Your caregiver will give you specific directions depending on your situation. PROCEDURE   If done 1-2 days after delivery:  You will be given a medicine to make you sleep (general anesthetic) during the procedure.  A tube will be put down your throat to  help your breath while under general anesthesia.  A small cut (incision) is made just beneath the belly button.  The fallopian tubes are brought up through the incision.  The fallopian tubes are then sealed, tied, or cut.  If done after a caesarean delivery:  Tubal ligation is done through the incision already made (after the baby is delivered).  Once the tubes are blocked, the incision is closed with stitches (sutures). A bandage will be placed over the incisions. AFTER THE PROCEDURE  You may have some pain or cramps in the abdominal area for the next 3-7 days.  You will be given pain medicine to ease any discomfort.  You may also feel sick to your stomach if you were given a general anesthetic.  You may have some mild discomfort in the throat.  This is from the tube that may have been placed in your throat while you were sleeping.  You may feel tired and should rest the remainder of the day.  Report inflammation or drainage from incision and fever postpartum

## 2015-03-22 NOTE — Discharge Summary (Signed)
Physician Obstetric Discharge Summary  Patient ID: Toni Carter MRN: [id number] DOB/AGE: 1990/10/28 24 y.o.   Date of Admission: 03/19/2015  Date of Discharge: 03/22/2015  Admitting Diagnosis: Induction of labor at [redacted]w[redacted]d  Secondary Diagnosis: Undesired fertility  Mode of Delivery: normal spontaneous vaginal delivery 03/20/2015      Discharge Diagnosis: No other diagnosis   Intrapartum Procedures: pitocin augmentation, amniotomy, epidural anesthesia   Post partum procedures: postpartum tubal ligation  Complications: nausea and vomiting post tubal ligation   Toni Carter is a [id number] who had a SVD on 03/20/2015;  for further details of this delivery, please refer to the delivery note.  Patient had a  postpartum tubal ligation on postpartum day #1.  She experienced nausea and vomiting after her surgery, but by time of discharge on PPD#2, she had tolerated a regular diet for breakfast, her pain was controlled on oral pain medications; she had appropriate lochia and was ambulating, and  voiding without difficulty   She was deemed stable for discharge to home.    Labs: CBC Latest Ref Rng 03/22/2015 03/21/2015 03/19/2015  WBC 3.6 - 11.0 K/uL 11.7(H) 11.6(H) 10.6  Hemoglobin 12.0 - 16.0 g/dL 10.5(L) 11.1(L) 12.5  Hematocrit 35.0 - 47.0 % 31.4(L) 33.2(L) 36.0  Platelets 150 - 440 K/uL 130(L) 115(L) 128(L)   AB POS/ RI/ VI  Physical exam:  Blood pressure 102/56, pulse 66, temperature 98.1 F (36.7 C), temperature source Oral, resp. rate 16, height 5\' 10"  (1.778 m), weight 235 lb (106.595 kg), SpO2 97 %, unknown if currently breastfeeding. General: alert and no distress Lochia: appropriate Abdomen: soft, NT Uterine Fundus: firm Umbilical Incision: dsg C+D+I Extremities: No evidence of DVT seen on physical exam. No lower extremity edema.  Discharge Instructions: Per After Visit Summary. Activity: Advance as tolerated. Pelvic rest for 6 weeks.  Also refer to  Discharge Instructions Diet: Regular Medications:   Medication List    TAKE these medications        ibuprofen 600 MG tablet  Commonly known as:  ADVIL,MOTRIN  Take 1 tablet (600 mg total) by mouth every 6 (six) hours as needed.     oxyCODONE-acetaminophen 5-325 MG tablet  Commonly known as:  PERCOCET/ROXICET  Take 1-2 tablets by mouth every 6 (six) hours as needed for moderate pain or severe pain.     prenatal multivitamin Tabs tablet  Take 1 tablet by mouth daily at 12 noon.       Colace 100 mgm once or twice a day prn constipation Outpatient follow up:  Follow-up Information    Follow up with Toni Carter, CNM. Call in 6 weeks.   Specialty:  Certified Nurse Midwife   Why:  for 6 week check or can follow up with [facility]   Contact information:   [address] [place] [address] [phone]      Postpartum contraception: bilateral tubal ligation  Discharged Condition: stable  Discharged to: home   Newborn Data: Disposition:home with mother  Apgars: APGAR (1 MIN): 7   APGAR (5 MINS): 9   APGAR (10 MINS):    Baby Feeding: Bottle/ Toni Carter, Toni Carter 03/22/2015 10:58 AM

## 2015-03-22 NOTE — Progress Notes (Signed)
Patient discharged home with infant. Vital signs stable, bleeding within normal limits, uterus firm. Discharge instructions, prescriptions, and follow up appointment given to and reviewed with patient. Patient verbalized understanding, all questions answered. Escorted in wheelchair by nursing.    

## 2015-03-25 LAB — SURGICAL PATHOLOGY

## 2015-03-27 NOTE — Anesthesia Postprocedure Evaluation (Signed)
  Anesthesia Post-op Note  Patient: Toni Carter  Procedure(s) Performed: Procedure(s): POST PARTUM TUBAL LIGATION (Bilateral)  Anesthesia type:General  Patient location: PACU  Post pain: Pain level controlled  Post assessment: Post-op Vital signs reviewed, Patient's Cardiovascular Status Stable, Respiratory Function Stable, Patent Airway and No signs of Nausea or vomiting  Post vital signs: Reviewed and stable  Last Vitals:  Filed Vitals:   03/22/15 0813  BP: 102/56  Pulse: 66  Temp: 36.7 C  Resp: 16    Level of consciousness: awake, alert  and patient cooperative  Complications: No apparent anesthesia complications

## 2015-04-23 ENCOUNTER — Emergency Department: Payer: Medicaid Other

## 2015-04-23 ENCOUNTER — Encounter: Payer: Self-pay | Admitting: Emergency Medicine

## 2015-04-23 ENCOUNTER — Emergency Department
Admission: EM | Admit: 2015-04-23 | Discharge: 2015-04-23 | Disposition: A | Discharge status: Home / Self Care | Payer: Medicaid Other | Attending: Emergency Medicine | Admitting: Emergency Medicine

## 2015-04-23 DIAGNOSIS — Z79899 Other long term (current) drug therapy: Secondary | ICD-10-CM | POA: Insufficient documentation

## 2015-04-23 DIAGNOSIS — N939 Abnormal uterine and vaginal bleeding, unspecified: Secondary | ICD-10-CM | POA: Diagnosis not present

## 2015-04-23 DIAGNOSIS — Z3202 Encounter for pregnancy test, result negative: Secondary | ICD-10-CM | POA: Diagnosis not present

## 2015-04-23 LAB — CBC
HEMATOCRIT: 38.3 % (ref 35.0–47.0)
Hemoglobin: 13.1 g/dL (ref 12.0–16.0)
MCH: 28.3 pg (ref 26.0–34.0)
MCHC: 34.1 g/dL (ref 32.0–36.0)
MCV: 82.8 fL (ref 80.0–100.0)
PLATELETS: 144 10*3/uL — AB (ref 150–440)
RBC: 4.62 MIL/uL (ref 3.80–5.20)
RDW: 14.7 % — ABNORMAL HIGH (ref 11.5–14.5)
WBC: 7.8 10*3/uL (ref 3.6–11.0)

## 2015-04-23 LAB — HCG, QUANTITATIVE, PREGNANCY: HCG, BETA CHAIN, QUANT, S: 1 m[IU]/mL (ref <5)

## 2015-04-23 NOTE — ED Notes (Signed)
Pt postpartum five weeks c/o vaginal bleeding and thinks she may have been pregant and did not know it. Pt states about twenty minutes ago she passed a large object that look like a fetus through her vagina and feels like there is still something dangling out of her vagina.  Pt then flushed down the toilet and was told to come to er per [facility].

## 2015-04-23 NOTE — Discharge Instructions (Signed)

## 2015-04-23 NOTE — ED Provider Notes (Signed)
[facility] Emergency Department Provider Note  ____________________________________________  Time seen: 4:50 PM  I have reviewed the triage vital signs and the nursing notes.   HISTORY  Chief Complaint Vaginal Bleeding    HPI Toni Carter is a 24 y.o. female who is 5 weeks postpartum who reports ongoing vaginal bleeding since delivery. It did seem to taper off after the first week or 2, but then over the last several days has increased quite a bit again. This afternoon around 2:45 PM she passed a clump of tissue from the vagina with a large amount of blood. She denies chest pain shortness of breath dizziness syncope fevers chills abdominal pain other than some low pelvic cramping, no back pain. No vomiting or diarrhea.  Her newborn is bottle fed.   Past Medical History  Diagnosis Date  . Headache      Patient Active Problem List   Diagnosis Date Noted  . Postpartum care following vaginal delivery 03/19/2015     Past Surgical History  Procedure Laterality Date  . No past surgeries    . Tubal ligation Bilateral 03/21/2015    Procedure: POST PARTUM TUBAL LIGATION;  Surgeon: Malachy Mood, MD;  Location: [facility] ORS;  Service: Gynecology;  Laterality: Bilateral;     Current Outpatient Rx  Name  Route  Sig  Dispense  Refill  . ibuprofen (ADVIL,MOTRIN) 600 MG tablet   Oral   Take 1 tablet (600 mg total) by mouth every 6 (six) hours as needed.   50 tablet   1   . oxyCODONE-acetaminophen (PERCOCET/ROXICET) 5-325 MG tablet   Oral   Take 1-2 tablets by mouth every 6 (six) hours as needed for moderate pain or severe pain.   30 tablet   0   . Prenatal Vit-Fe Fumarate-FA (PRENATAL MULTIVITAMIN) TABS tablet   Oral   Take 1 tablet by mouth daily at 12 noon.            Allergies Morphine and related and Tape   No family history on file.  Social History Social History  Substance Use Topics  . Smoking status: Never Smoker   .  Smokeless tobacco: Never Used  . Alcohol Use: No    Review of Systems  Constitutional:   No fever or chills. No weight changes Eyes:   No blurry vision or double vision.  ENT:   No sore throat. Cardiovascular:   No chest pain. Respiratory:   No dyspnea or cough. Gastrointestinal:   Positive for abdominal pain, without vomiting and diarrhea.  No BRBPR or melena. Genitourinary:   Negative for dysuria, urinary retention, bloody urine, or difficulty urinating. Positive vaginal bleeding ongoing for 5 weeks since delivery, passed tissue today Musculoskeletal:   Negative for back pain. No joint swelling or pain. Skin:   Negative for rash. Neurological:   Negative for headaches, focal weakness or numbness. Psychiatric:  No anxiety or depression.   Endocrine:  No hot/cold intolerance, changes in energy, or sleep difficulty.  10-point ROS otherwise negative.  ____________________________________________   PHYSICAL EXAM:  VITAL SIGNS: ED Triage Vitals  Enc Vitals Group     BP 04/23/15 1547 129/69 mmHg     Pulse Rate 04/23/15 1547 76     Resp 04/23/15 1547 18     Temp 04/23/15 1547 98.4 F (36.9 C)     Temp Source 04/23/15 1547 Oral     SpO2 04/23/15 1547 97 %     Weight 04/23/15 1547 216 lb (97.977 kg)  Height 04/23/15 1547 5\' 10"  (1.778 m)     Head Cir --      Peak Flow --      Pain Score --      Pain Loc --      Pain Edu? --      Excl. in [place]? --      Constitutional:   Alert and oriented. Well appearing and in no distress. Eyes:   No scleral icterus. No conjunctival pallor. PERRL. EOMI ENT   Head:   Normocephalic and atraumatic.   Nose:   No congestion/rhinnorhea. No septal hematoma   Mouth/Throat:   MMM, no pharyngeal erythema. No peritonsillar mass. No uvula shift.   Neck:   No stridor. No SubQ emphysema. No meningismus. Hematological/Lymphatic/Immunilogical:   No cervical lymphadenopathy. Cardiovascular:   RRR. Normal and symmetric distal pulses are  present in all extremities. No murmurs, rubs, or gallops. Respiratory:   Normal respiratory effort without tachypnea nor retractions. Breath sounds are clear and equal bilaterally. No wheezes/rales/rhonchi. Gastrointestinal:   Soft with mild suprapubic tenderness. No distention. There is no CVA tenderness.  No rebound, rigidity, or guarding. Genitourinary:   Pelvic exam with nurse at bedside reveals normal external genitalia. Speculum exam reveals a scant amount of blood in the vaginal vault without any tissue present. Bimanual exam reveals approximately 2 cm open external cervical os with closed internal os, cervix nontender, uterus small and contracted and nontender on bimanual exam, no adnexal tenderness or masses. Musculoskeletal:   Nontender with normal range of motion in all extremities. No joint effusions.  No lower extremity tenderness.  No edema. Neurologic:   Normal speech and language.  CN 2-10 normal. Motor grossly intact. No pronator drift.  Normal gait. No gross focal neurologic deficits are appreciated.  Skin:    Skin is warm, dry and intact. No rash noted.  No petechiae, purpura, or bullae. Psychiatric:   Mood and affect are normal. Speech and behavior are normal. Patient exhibits appropriate insight and judgment.  ____________________________________________    LABS (pertinent positives/negatives) (all labs ordered are listed, but only abnormal results are displayed) Labs Reviewed  CBC - Abnormal; Notable for the following:    RDW 14.7 (*)    Platelets 144 (*)    All other components within normal limits  HCG, QUANTITATIVE, PREGNANCY   ____________________________________________   EKG    ____________________________________________    RADIOLOGY    ____________________________________________   PROCEDURES   ____________________________________________   INITIAL IMPRESSION / ASSESSMENT AND PLAN / ED COURSE  Pertinent labs & imaging results that were  available during my care of the patient were reviewed by me and considered in my medical decision making (see chart for details).  Patient showed me a picture of the tissue that she passed, does appear to look like a small amount of placental tissue with some attached membrane suggestive of retained POC's that prevented full uterine contraction allowing for some ongoing bleeding. She's not had any symptoms of endometritis and has likely passed the remaining amount of tissue today. Bimanual exam is reassuring as well and is consistent with a recently passed tissue. We'll go ahead and get an ultrasound of the pelvis to evaluate for any further retained POC's. Patient is in agreement.  ----------------------------------------- 6:43 PM on 04/23/2015 -----------------------------------------  Patient now refusing the ultrasound. She just wants to go home. She does have medical decision-making capacity. Vital signs are normal and she is stable and suitable for outpatient follow-up and there is no evidence of  uterine perforation or sepsis. We'll discharge the patient and have her follow-up with [facility].     ____________________________________________   FINAL CLINICAL IMPRESSION(S) / ED DIAGNOSES  Final diagnoses:  Vaginal bleeding      Carrie Mew, MD 04/23/15 1844

## 2015-06-02 ENCOUNTER — Encounter: Payer: Self-pay | Admitting: Emergency Medicine

## 2015-06-02 ENCOUNTER — Emergency Department
Admission: EM | Admit: 2015-06-02 | Discharge: 2015-06-02 | Disposition: A | Discharge status: Home / Self Care | Payer: Medicaid Other | Attending: Emergency Medicine | Admitting: Emergency Medicine

## 2015-06-02 DIAGNOSIS — N938 Other specified abnormal uterine and vaginal bleeding: Secondary | ICD-10-CM | POA: Diagnosis not present

## 2015-06-02 DIAGNOSIS — N939 Abnormal uterine and vaginal bleeding, unspecified: Secondary | ICD-10-CM | POA: Diagnosis present

## 2015-06-02 DIAGNOSIS — Z79899 Other long term (current) drug therapy: Secondary | ICD-10-CM | POA: Diagnosis not present

## 2015-06-02 LAB — BASIC METABOLIC PANEL
Anion gap: 6 (ref 5–15)
BUN: 10 mg/dL (ref 6–20)
CHLORIDE: 107 mmol/L (ref 101–111)
CO2: 28 mmol/L (ref 22–32)
CREATININE: 0.66 mg/dL (ref 0.44–1.00)
Calcium: 9.4 mg/dL (ref 8.9–10.3)
GFR calc Af Amer: >60 mL/min (ref >60)
GFR calc non Af Amer: >60 mL/min (ref >60)
GLUCOSE: 123 mg/dL — AB (ref 65–99)
POTASSIUM: 4.1 mmol/L (ref 3.5–5.1)
Sodium: 141 mmol/L (ref 135–145)

## 2015-06-02 LAB — CBC
HCT: 38.2 % (ref 35.0–47.0)
Hemoglobin: 12.8 g/dL (ref 12.0–16.0)
MCH: 28.1 pg (ref 26.0–34.0)
MCHC: 33.4 g/dL (ref 32.0–36.0)
MCV: 84.3 fL (ref 80.0–100.0)
PLATELETS: 163 10*3/uL (ref 150–440)
RBC: 4.54 MIL/uL (ref 3.80–5.20)
RDW: 15 % — AB (ref 11.5–14.5)
WBC: 8.3 10*3/uL (ref 3.6–11.0)

## 2015-06-02 LAB — URINALYSIS COMPLETE WITH MICROSCOPIC (ARMC ONLY)
Bilirubin Urine: NEGATIVE
Glucose, UA: NEGATIVE mg/dL
Hgb urine dipstick: NEGATIVE
KETONES UR: NEGATIVE mg/dL
Leukocytes, UA: NEGATIVE
Nitrite: NEGATIVE
PH: 7 (ref 5.0–8.0)
PROTEIN: NEGATIVE mg/dL
SPECIFIC GRAVITY, URINE: 1.015 (ref 1.005–1.030)

## 2015-06-02 NOTE — ED Notes (Signed)
Pt discharged home after verbalizing understanding of discharge instructions; nad noted. 

## 2015-06-02 NOTE — ED Provider Notes (Signed)
[facility] Emergency Department Provider Note  ____________________________________________  Time seen: On arrival  I have reviewed the triage vital signs and the nursing notes.   HISTORY  Chief Complaint Vaginal Bleeding    HPI Toni Carter is a 24 y.o. female who had a vaginal delivery on 04/19/2015 reportedly has been bleeding every day since. Dr. Star Age that her on hormones which gave her one day of relief but her bleeding started again. She denies dizziness or weakness. She called the GYN office today and recommended she come to the emergency department. She reports she is currently having only mild bleeding. She does have occasional cramping in her pelvis.     Past Medical History  Diagnosis Date  . Headache     Patient Active Problem List   Diagnosis Date Noted  . Postpartum care following vaginal delivery 03/19/2015    Past Surgical History  Procedure Laterality Date  . No past surgeries    . Tubal ligation Bilateral 03/21/2015    Procedure: POST PARTUM TUBAL LIGATION;  Surgeon: Malachy Mood, MD;  Location: [facility] ORS;  Service: Gynecology;  Laterality: Bilateral;    Current Outpatient Rx  Name  Route  Sig  Dispense  Refill  . ibuprofen (ADVIL,MOTRIN) 600 MG tablet   Oral   Take 1 tablet (600 mg total) by mouth every 6 (six) hours as needed.   50 tablet   1   . oxyCODONE-acetaminophen (PERCOCET/ROXICET) 5-325 MG tablet   Oral   Take 1-2 tablets by mouth every 6 (six) hours as needed for moderate pain or severe pain.   30 tablet   0   . Prenatal Vit-Fe Fumarate-FA (PRENATAL MULTIVITAMIN) TABS tablet   Oral   Take 1 tablet by mouth daily at 12 noon.           Allergies Morphine and related; Tape; and Vicodin  No family history on file.  Social History Social History  Substance Use Topics  . Smoking status: Never Smoker   . Smokeless tobacco: Never Used  . Alcohol Use: No    Review of  Systems  Constitutional: Negative for fever. Eyes: Negative for visual changes.  Cardiovascular: Negative for chest pain. Respiratory: Negative for shortness of breath. Gastrointestinal: As above Genitourinary: Negative for dysuria. Positive for vaginal bleeding Musculoskeletal: Negative for back pain. Skin: Negative for rash. Neurological: Negative for headaches or focal weakness Psychiatric: No anxiety    ____________________________________________   PHYSICAL EXAM:  VITAL SIGNS: ED Triage Vitals  Enc Vitals Group     BP 06/02/15 1641 128/72 mmHg     Pulse Rate 06/02/15 1808 63     Resp 06/02/15 1641 18     Temp 06/02/15 1641 98.5 F (36.9 C)     Temp Source 06/02/15 1641 Oral     SpO2 06/02/15 1641 99 %     Weight 06/02/15 1641 210 lb (95.255 kg)     Height 06/02/15 1641 5\' 10"  (1.778 m)     Head Cir --      Peak Flow --      Pain Score 06/02/15 1641 7     Pain Loc --      Pain Edu? --      Excl. in [place]? --      Constitutional: Alert and oriented. Well appearing and in no distress. Eyes: Conjunctivae are normal.  ENT   Head: Normocephalic and atraumatic.   Mouth/Throat: Mucous membranes are moist. Cardiovascular: Normal rate, regular rhythm. Normal and symmetric  distal pulses are present in all extremities. No murmurs, rubs, or gallops. Respiratory: Normal respiratory effort without tachypnea nor retractions. Breath sounds are clear and equal bilaterally.  Gastrointestinal: Soft and non-tender in all quadrants. No distention. There is no CVA tenderness. Genitourinary: deferred Musculoskeletal: Nontender with normal range of motion in all extremities. No lower extremity tenderness nor edema. Neurologic:  Normal speech and language. No gross focal neurologic deficits are appreciated. Skin:  Skin is warm, dry and intact. No rash noted. Psychiatric: Mood and affect are normal. Patient exhibits appropriate insight and  judgment.  ____________________________________________    LABS (pertinent positives/negatives)  Labs Reviewed  BASIC METABOLIC PANEL - Abnormal; Notable for the following:    Glucose, Bld 123 (*)    All other components within normal limits  CBC - Abnormal; Notable for the following:    RDW 15.0 (*)    All other components within normal limits  URINALYSIS COMPLETEWITH MICROSCOPIC ([facility] ONLY) - Abnormal; Notable for the following:    Color, Urine YELLOW (*)    APPearance CLEAR (*)    Bacteria, UA RARE (*)    Squamous Epithelial / LPF 0-5 (*)    All other components within normal limits    ____________________________________________   EKG  None  ____________________________________________    RADIOLOGY I have personally reviewed any xrays that were ordered on this patient: None  ____________________________________________   PROCEDURES  Procedure(s) performed: none  Critical Care performed: none  ____________________________________________   INITIAL IMPRESSION / ASSESSMENT AND PLAN / ED COURSE  Pertinent labs & imaging results that were available during my care of the patient were reviewed by me and considered in my medical decision making (see chart for details).  Discussed with Dr. Kenton Kingfisher of GYN, he recommends no imaging at this time he will see the patient is office tomorrow and schedule for [facility]  ____________________________________________   FINAL CLINICAL IMPRESSION(S) / ED DIAGNOSES  Final diagnoses:  DUB (dysfunctional uterine bleeding)     [name], MD 06/02/15 1845

## 2015-06-02 NOTE — ED Notes (Signed)
Pt states she has been bleeding vaginally for 10 weeks now, has had cramping every day as well. Pt states her OBGYN put her on a medication that she finished Sunday, she called the office, and told her to be evaluated here. Pt appears in no distress, skin pink.

## 2015-06-02 NOTE — Discharge Instructions (Signed)

## 2015-06-02 NOTE — ED Notes (Signed)
Pt had a baby 10weeks ago.

## 2015-06-02 NOTE — ED Notes (Signed)
Pt reports that she has been bleeding non-stop since delivering her son in September, save a few one-day breaks. Pt states she was put on Medroxyprogesterone x 10 days and was told by Dr. Star Age that her bleeding would stop. She finished the medication over the weekend, and it still hasn't stopped. She called the office today and was told that Dr. Star Age would not be in and she should go to the ED.

## 2015-06-13 ENCOUNTER — Other Ambulatory Visit: Payer: Self-pay | Admitting: Family Medicine

## 2015-06-13 ENCOUNTER — Ambulatory Visit: Payer: Medicaid Other

## 2015-06-13 ENCOUNTER — Emergency Department
Admission: EM | Admit: 2015-06-13 | Discharge: 2015-06-14 | Disposition: A | Discharge status: Home / Self Care | Payer: Medicaid Other | Attending: Emergency Medicine | Admitting: Emergency Medicine

## 2015-06-13 ENCOUNTER — Encounter: Payer: Self-pay | Admitting: Emergency Medicine

## 2015-06-13 DIAGNOSIS — N939 Abnormal uterine and vaginal bleeding, unspecified: Secondary | ICD-10-CM

## 2015-06-13 DIAGNOSIS — Z3202 Encounter for pregnancy test, result negative: Secondary | ICD-10-CM | POA: Insufficient documentation

## 2015-06-13 DIAGNOSIS — R51 Headache: Secondary | ICD-10-CM | POA: Insufficient documentation

## 2015-06-13 DIAGNOSIS — Z79899 Other long term (current) drug therapy: Secondary | ICD-10-CM | POA: Insufficient documentation

## 2015-06-13 DIAGNOSIS — R079 Chest pain, unspecified: Secondary | ICD-10-CM | POA: Diagnosis not present

## 2015-06-13 DIAGNOSIS — R1011 Right upper quadrant pain: Secondary | ICD-10-CM

## 2015-06-13 LAB — COMPREHENSIVE METABOLIC PANEL
ALT: 128 U/L — ABNORMAL HIGH (ref 14–54)
AST: 88 U/L — AB (ref 15–41)
Albumin: 4.2 g/dL (ref 3.5–5.0)
Alkaline Phosphatase: 106 U/L (ref 38–126)
Anion gap: 9 (ref 5–15)
BUN: 16 mg/dL (ref 6–20)
CALCIUM: 8.7 mg/dL — AB (ref 8.9–10.3)
CO2: 23 mmol/L (ref 22–32)
CREATININE: 0.55 mg/dL (ref 0.44–1.00)
Chloride: 107 mmol/L (ref 101–111)
GFR calc Af Amer: >60 mL/min (ref >60)
GLUCOSE: 109 mg/dL — AB (ref 65–99)
Potassium: 3.5 mmol/L (ref 3.5–5.1)
Sodium: 139 mmol/L (ref 135–145)
TOTAL PROTEIN: 7 g/dL (ref 6.5–8.1)
Total Bilirubin: 0.6 mg/dL (ref 0.3–1.2)

## 2015-06-13 LAB — CBC WITH DIFFERENTIAL/PLATELET
BASOS ABS: 0 10*3/uL (ref 0–0.1)
Basophils Relative: 0 %
EOS ABS: 0 10*3/uL (ref 0–0.7)
EOS PCT: 0 %
HEMATOCRIT: 36.1 % (ref 35.0–47.0)
Hemoglobin: 12.2 g/dL (ref 12.0–16.0)
Lymphocytes Relative: 17 %
Lymphs Abs: 1.4 10*3/uL (ref 1.0–3.6)
MCH: 28.1 pg (ref 26.0–34.0)
MCHC: 33.8 g/dL (ref 32.0–36.0)
MCV: 83 fL (ref 80.0–100.0)
MONO ABS: 0.6 10*3/uL (ref 0.2–0.9)
MONOS PCT: 7 %
Neutro Abs: 6.3 10*3/uL (ref 1.4–6.5)
Neutrophils Relative %: 76 %
PLATELETS: 139 10*3/uL — AB (ref 150–440)
RBC: 4.35 MIL/uL (ref 3.80–5.20)
RDW: 14.9 % — AB (ref 11.5–14.5)
WBC: 8.3 10*3/uL (ref 3.6–11.0)

## 2015-06-13 LAB — LIPASE, BLOOD: LIPASE: 25 U/L (ref 11–51)

## 2015-06-13 MED ORDER — HYDROMORPHONE HCL 1 MG/ML IJ SOLN
0.5000 mg | Freq: Once | INTRAMUSCULAR | Status: AC
  Administered 2015-06-14: 0.5 mg via INTRAVENOUS
  Filled 2015-06-13: qty 1

## 2015-06-13 MED ORDER — SODIUM CHLORIDE 0.9 % IV SOLN
Freq: Once | INTRAVENOUS | Status: AC
  Administered 2015-06-13: via INTRAVENOUS

## 2015-06-13 MED ORDER — PROCHLORPERAZINE EDISYLATE 5 MG/ML IJ SOLN
10.0000 mg | Freq: Four times a day (QID) | INTRAMUSCULAR | Status: DC | PRN
  Administered 2015-06-14: 10 mg via INTRAVENOUS
  Filled 2015-06-13: qty 2

## 2015-06-13 NOTE — ED Notes (Signed)
Per EMS, patient c/o vaginal bleeding x3 months (ever since she had her baby Sept 29th) and abd pain since Monday. Patient rates her pain 10/10 in the RUQ stating it is sharp pain. Patient was seen at her PCP today and they told her to come back tomorrow for blood work. Patient states her liver enzymes are abnormal and that she has low blood counts. Low grade fever per EMS, 99.9. EMS states patient was rolling around in bed when they arrived. EMS states patient took a percocet before they left and slept the whole way here. Patients current temp 99.5. A&O x4. 10/10 RUQ abd pain. NS on monitor.

## 2015-06-13 NOTE — ED Provider Notes (Addendum)
[facility] Emergency Department Provider Note  ____________________________________________  Time seen: Approximately 11:22 PM  I have reviewed the triage vital signs and the nursing notes.   HISTORY  Chief Complaint Vaginal Bleeding and Abdominal Pain    HPI Toni Carter is a 24 y.o. female patient reports bleeding every day since she delivered her baby about 3 months ago. Bleeding is not heavy but is daily. Patient reports about one week of right upper quadrant pain severe in nature crampy at times worse with deep breathing or movement. Patient also had a daily headache migraine every day for a week. Patient has a throbbing diffuse headache at the present time. As is made worse if she moves her head.   Past Medical History  Diagnosis Date  . Headache     Patient Active Problem List   Diagnosis Date Noted  . Postpartum care following vaginal delivery 03/19/2015    Past Surgical History  Procedure Laterality Date  . No past surgeries    . Tubal ligation Bilateral 03/21/2015    Procedure: POST PARTUM TUBAL LIGATION;  Surgeon: Malachy Mood, MD;  Location: [facility] ORS;  Service: Gynecology;  Laterality: Bilateral;    Current Outpatient Rx  Name  Route  Sig  Dispense  Refill  . acetaminophen (TYLENOL) 325 MG tablet   Oral   Take 650 mg by mouth every 6 (six) hours as needed.         Marland Kitchen oxyCODONE-acetaminophen (PERCOCET/ROXICET) 5-325 MG tablet   Oral   Take 1-2 tablets by mouth every 6 (six) hours as needed for moderate pain or severe pain.   30 tablet   0   . Prenatal Vit-Fe Fumarate-FA (PRENATAL MULTIVITAMIN) TABS tablet   Oral   Take 1 tablet by mouth daily at 12 noon.         Marland Kitchen ibuprofen (ADVIL,MOTRIN) 600 MG tablet   Oral   Take 1 tablet (600 mg total) by mouth every 6 (six) hours as needed.   50 tablet   1   . norethindrone (AYGESTIN) 5 MG tablet   Oral   Take 2 tablets (10 mg total) by mouth 4 (four) times daily.   40  tablet   0   . oxyCODONE (ROXICODONE) 5 MG immediate release tablet   Oral   Take 1 tablet (5 mg total) by mouth every 6 (six) hours as needed for severe pain (take 1 to 2 pills up to 4 times as day as needed for pain).   20 tablet   0     Allergies Morphine and related; Tape; and Vicodin  History reviewed. No pertinent family history.  Social History Social History  Substance Use Topics  . Smoking status: Never Smoker   . Smokeless tobacco: Never Used  . Alcohol Use: No    Review of Systems Constitutional: No fever/chills Eyes: No visual changes. ENT: No sore throat. Cardiovascular: Denies chest pain. Respiratory: Denies shortness of breath. Gastrointestinal abdominal pain.  No nausea, no vomiting.  No diarrhea.  No constipation. Genitourinary: Negative for dysuria. Musculoskeletal: Negative for back pain. Skin: Negative for rash. Neurological: Negative for focal weakness or numbness.  10-point ROS otherwise negative.  ____________________________________________   PHYSICAL EXAM:  VITAL SIGNS: ED Triage Vitals  Enc Vitals Group     BP 06/13/15 2254 132/83 mmHg     Pulse Rate 06/13/15 2254 92     Resp 06/13/15 2254 22     Temp 06/13/15 2254 99.5 F (37.5 C)  Temp Source 06/13/15 2254 Oral     SpO2 06/13/15 2254 95 %     Weight 06/13/15 2254 222 lb (100.699 kg)     Height 06/13/15 2254 5\' 10"  (1.778 m)     Head Cir --      Peak Flow --      Pain Score 06/13/15 2255 10     Pain Loc --      Pain Edu? --      Excl. in [place]? --     Constitutional: Alert and oriented. Well appearing and in mild distress Eyes: Conjunctivae are normal. PERRL. EOMI. Head: Atraumatic. Nose: No congestion/rhinnorhea. Mouth/Throat: Mucous membranes are moist.  Oropharynx non-erythematous. Neck: No stridor. No cervical adenopathy neck is supple  Cardiovascular: Normal rate, regular rhythm. Grossly normal heart sounds.  Good peripheral circulation. Respiratory: Normal  respiratory effort.  No retractions. Lungs CTAB. Gastrointestinal: Soft and nontender. No distention. No abdominal bruits. No CVA tenderness. Musculoskeletal: No lower extremity tenderness nor edema.  No joint effusions. Neurologic:  Normal speech and language. No gross focal neurologic deficits are appreciated.  Skin:  Skin is warm, dry and intact. No rash noted. Psychiatric: Mood and affect are normal. Speech and behavior are normal. GYN: Dark blood in the vagina. 4 large Q-tips were soaked with blood in then there was no blood left in the vagina I waited for about a minute there was no blood filling the vagina patient does appear to have bleeding but it is not extremely heavy. We will do the norethindrone 4 times a day as the gynecologist Dr. Ilda Basset recommended ____________________________________________   LABS (all labs ordered are listed, but only abnormal results are displayed)  Labs Reviewed  COMPREHENSIVE METABOLIC PANEL - Abnormal; Notable for the following:    Glucose, Bld 109 (*)    Calcium 8.7 (*)    AST 88 (*)    ALT 128 (*)    All other components within normal limits  CBC WITH DIFFERENTIAL/PLATELET - Abnormal; Notable for the following:    RDW 14.9 (*)    Platelets 139 (*)    All other components within normal limits  URINALYSIS COMPLETEWITH MICROSCOPIC ([facility] ONLY) - Abnormal; Notable for the following:    Color, Urine YELLOW (*)    APPearance CLEAR (*)    Hgb urine dipstick 2+ (*)    Protein, ur 30 (*)    Bacteria, UA RARE (*)    Squamous Epithelial / LPF 0-5 (*)    All other components within normal limits  LIPASE, BLOOD  PREGNANCY, URINE  APTT  PROTIME-INR  HEPATITIS PANEL, ACUTE  POC URINE PREG, ED  POCT PREGNANCY, URINE   ____________________________________________  EKG EKG read and interpreted by me shows sinus rhythm at a rate of 91 normal axis no acute ST-T wave changes ____________________________________________  RADIOLOGY  Ultrasound of the  right upper quadrant and the pelvis show no acute pathology per radiology ____________________________________________   PROCEDURES Patient and husband notes they've further that patient had recent retained placenta about a month after the delivery. Patient's continues to have bleeding soaking a pad every hour and a half or so. Patient also says some of the chest pain is pleuritic. Please see the above GYN note the GYN exam was done just before patient discharge  ____________________________________________   INITIAL IMPRESSION / [place] / ED COURSE  Pertinent labs & imaging results that were available during my care of the patient were reviewed by me and considered in my medical decision  making (see chart for details).   ____________________________________________   FINAL CLINICAL IMPRESSION(S) / ED DIAGNOSES  Final diagnoses:  Vaginal bleeding      Nena Polio, MD 06/14/15 [phone]  Please note I discussed the patient with Dr. Ilda Basset prior to discharge she recommended the norethindrone 10 mg 4 times a day  Nena Polio, MD 06/14/15 [phone]

## 2015-06-14 ENCOUNTER — Emergency Department: Payer: Medicaid Other

## 2015-06-14 ENCOUNTER — Other Ambulatory Visit: Payer: Medicaid Other

## 2015-06-14 LAB — URINALYSIS COMPLETE WITH MICROSCOPIC (ARMC ONLY)
BILIRUBIN URINE: NEGATIVE
GLUCOSE, UA: NEGATIVE mg/dL
KETONES UR: NEGATIVE mg/dL
LEUKOCYTES UA: NEGATIVE
NITRITE: NEGATIVE
PROTEIN: 30 mg/dL — AB
Specific Gravity, Urine: 1.028 (ref 1.005–1.030)
pH: 7 (ref 5.0–8.0)

## 2015-06-14 LAB — APTT: aPTT: 27 seconds (ref 24–36)

## 2015-06-14 LAB — POCT PREGNANCY, URINE: PREG TEST UR: NEGATIVE

## 2015-06-14 LAB — PROTIME-INR
INR: 1.15
Prothrombin Time: 14.9 seconds (ref 11.4–15.0)

## 2015-06-14 LAB — PREGNANCY, URINE: PREG TEST UR: NEGATIVE

## 2015-06-14 MED ORDER — IOHEXOL 350 MG/ML SOLN
100.0000 mL | Freq: Once | INTRAVENOUS | Status: AC | PRN
  Administered 2015-06-14: 100 mL via INTRAVENOUS

## 2015-06-14 MED ORDER — HYDROMORPHONE HCL 1 MG/ML IJ SOLN
1.0000 mg | Freq: Once | INTRAMUSCULAR | Status: AC
  Administered 2015-06-14: 1 mg via INTRAVENOUS
  Filled 2015-06-14: qty 1

## 2015-06-14 MED ORDER — NORETHINDRONE ACETATE 5 MG PO TABS: 10.0000 mg | ORAL_TABLET | Freq: Four times a day (QID) | ORAL | Status: DC

## 2015-06-14 MED ORDER — NORETHINDRONE ACETATE 5 MG PO TABS
10.0000 mg | ORAL_TABLET | Freq: Every day | ORAL | Status: DC
  Filled 2015-06-14: qty 2

## 2015-06-14 MED ORDER — OXYCODONE HCL 5 MG PO TABS: 5.0000 mg | ORAL_TABLET | Freq: Four times a day (QID) | ORAL | Status: DC | PRN

## 2015-06-14 NOTE — Discharge Instructions (Signed)
Abnormal Uterine Bleeding Abnormal uterine bleeding can affect women at various stages in life, including teenagers, women in their reproductive years, pregnant women, and women who have reached menopause. Several kinds of uterine bleeding are considered abnormal, including:  Bleeding or spotting between periods.   Bleeding after sexual intercourse.   Bleeding that is heavier or more than normal.   Periods that last longer than usual.  Bleeding after menopause.  Many cases of abnormal uterine bleeding are minor and simple to treat, while others are more serious. Any type of abnormal bleeding should be evaluated by your health care provider. Treatment will depend on the cause of the bleeding. HOME CARE INSTRUCTIONS Monitor your condition for any changes. The following actions may help to alleviate any discomfort you are experiencing:  Avoid the use of tampons and douches as directed by your health care provider.  Change your pads frequently. You should get regular pelvic exams and Pap tests. Keep all follow-up appointments for diagnostic tests as directed by your health care provider.  SEEK MEDICAL CARE IF:   Your bleeding lasts more than 1 week.   You feel dizzy at times.  SEEK IMMEDIATE MEDICAL CARE IF:   You pass out.   You are changing pads every 15 to 30 minutes.   You have abdominal pain.  You have a fever.   You become sweaty or weak.   You are passing large blood clots from the vagina.   You start to feel nauseous and vomit. MAKE SURE YOU:   Understand these instructions.  Will watch your condition.  Will get help right away if you are not doing well or get worse.   This information is not intended to replace advice given to you by your health care provider. Make sure you discuss any questions you have with your health care provider.   Document Released: 06/07/2005 Document Revised: 06/12/2013 Document Reviewed: 01/04/2013 Elsevier Interactive  Patient Education [employer].  Dr. Ilda Basset wants you to follow-up in the office on Monday. The meantime take the birth controls as directed they should get the bleeding to stop within a day or so. Sometimes they can make you a little bit nauseated. Please return for any increased pain, fever or any other problems. I will give you the oxycodone one pill 4 times a day as needed.

## 2015-06-14 NOTE — ED Notes (Signed)
Patient transported to [facility] 

## 2015-06-14 NOTE — ED Notes (Signed)
Pt dc home ambulatory denies pain instructed to get Rx filled ASAP PT NAD AT DC

## 2015-06-15 LAB — HEPATITIS PANEL, ACUTE
HCV Ab: <0.1 s/co ratio (ref 0.0–0.9)
HEP B S AG: NEGATIVE
Hep A IgM: NEGATIVE
Hep B C IgM: NEGATIVE

## 2015-06-30 ENCOUNTER — Inpatient Hospital Stay: Admission: RE | Admit: 2015-06-30 | Payer: Medicaid Other | Source: Ambulatory Visit

## 2015-06-30 ENCOUNTER — Encounter: Payer: Self-pay | Admitting: *Deleted

## 2015-06-30 NOTE — Patient Instructions (Signed)
  Your procedure is scheduled on: 07-04-15 (FRIDAY) Report to [place] To find out your arrival time please call [phone] between 1PM - 3PM on 07-03-15 (THURSDAY)  Remember: Instructions that are not followed completely may result in serious medical risk, up to and including death, or upon the discretion of your surgeon and anesthesiologist your surgery may need to be rescheduled.    _X___ 1. Do not eat food or drink liquids after midnight. No gum chewing or hard candies.     _X___ 2. No Alcohol for 24 hours before or after surgery.   ____ 3. Bring all medications with you on the day of surgery if instructed.    _X___ 4. Notify your doctor if there is any change in your medical condition     (cold, fever, infections).     Do not wear jewelry, make-up, hairpins, clips or nail polish.  Do not wear lotions, powders, or perfumes. You may wear deodorant.  Do not shave 48 hours prior to surgery. Men may shave face and neck.  Do not bring valuables to the hospital.    [facility] is not responsible for any belongings or valuables.               Contacts, dentures or bridgework may not be worn into surgery.  Leave your suitcase in the car. After surgery it may be brought to your room.  For patients admitted to the hospital, discharge time is determined by your treatment team.   Patients discharged the day of surgery will not be allowed to drive home.   Please read over the following fact sheets that you were given:      ____ Take these medicines the morning of surgery with A SIP OF WATER:    1. NONE  2.   3.   4.  5.  6.  ____ Fleet Enema (as directed)   _X___ Use CHG Soap as directed  ____ Use inhalers on the day of surgery  ____ Stop metformin 2 days prior to surgery    ____ Take 1/2 of usual insulin dose the night before surgery and none on the morning of surgery.   ____ Stop Coumadin/Plavix/aspirin-N/A  _X___ Stop  Anti-inflammatories-STOP IBUPROFEN NOW-NO NSAIDS OR ASPIRIN PRODUCTS-OXYCODONE OK TO TAKE   ____ Stop supplements until after surgery.    ____ Bring C-Pap to the hospital.

## 2015-07-02 ENCOUNTER — Encounter
Admission: RE | Admit: 2015-07-02 | Discharge: 2015-07-02 | Disposition: A | Discharge status: Home / Self Care | Payer: Medicaid Other | Source: Ambulatory Visit | Attending: Obstetrics & Gynecology | Admitting: Obstetrics & Gynecology

## 2015-07-02 DIAGNOSIS — Z01812 Encounter for preprocedural laboratory examination: Secondary | ICD-10-CM | POA: Insufficient documentation

## 2015-07-02 LAB — HEPATIC FUNCTION PANEL
ALT: 31 U/L (ref 14–54)
AST: 21 U/L (ref 15–41)
Albumin: 4.6 g/dL (ref 3.5–5.0)
Alkaline Phosphatase: 68 U/L (ref 38–126)
Bilirubin, Direct: <0.1 mg/dL — ABNORMAL LOW (ref 0.1–0.5)
TOTAL PROTEIN: 7.2 g/dL (ref 6.5–8.1)
Total Bilirubin: 0.5 mg/dL (ref 0.3–1.2)

## 2015-07-02 LAB — CBC
HCT: 38.5 % (ref 35.0–47.0)
HEMOGLOBIN: 12.8 g/dL (ref 12.0–16.0)
MCH: 27.7 pg (ref 26.0–34.0)
MCHC: 33.2 g/dL (ref 32.0–36.0)
MCV: 83.6 fL (ref 80.0–100.0)
Platelets: 150 10*3/uL (ref 150–440)
RBC: 4.6 MIL/uL (ref 3.80–5.20)
RDW: 14.6 % — ABNORMAL HIGH (ref 11.5–14.5)
WBC: 6.4 10*3/uL (ref 3.6–11.0)

## 2015-07-02 LAB — SURGICAL PCR SCREEN
MRSA, PCR: NEGATIVE
STAPHYLOCOCCUS AUREUS: NEGATIVE

## 2015-07-02 LAB — TYPE AND SCREEN
ABO/RH(D): AB POS
Antibody Screen: NEGATIVE
EXTEND SAMPLE REASON: UNDETERMINED

## 2015-07-03 ENCOUNTER — Encounter: Payer: Self-pay | Admitting: *Deleted

## 2015-07-04 ENCOUNTER — Ambulatory Visit: Payer: Medicaid Other | Admitting: Anesthesiology

## 2015-07-04 ENCOUNTER — Encounter: Payer: Self-pay | Admitting: *Deleted

## 2015-07-04 ENCOUNTER — Ambulatory Visit
Admission: RE | Admit: 2015-07-04 | Discharge: 2015-07-04 | Disposition: A | Discharge status: Home / Self Care | Payer: Medicaid Other | Source: Ambulatory Visit | Attending: Obstetrics & Gynecology | Admitting: Obstetrics & Gynecology

## 2015-07-04 ENCOUNTER — Encounter
Admission: RE | Disposition: A | Discharge status: Home / Self Care | Payer: Self-pay | Source: Ambulatory Visit | Attending: Obstetrics & Gynecology

## 2015-07-04 DIAGNOSIS — K219 Gastro-esophageal reflux disease without esophagitis: Secondary | ICD-10-CM | POA: Insufficient documentation

## 2015-07-04 DIAGNOSIS — G43909 Migraine, unspecified, not intractable, without status migrainosus: Secondary | ICD-10-CM | POA: Diagnosis not present

## 2015-07-04 DIAGNOSIS — Z9851 Tubal ligation status: Secondary | ICD-10-CM | POA: Diagnosis not present

## 2015-07-04 DIAGNOSIS — Z885 Allergy status to narcotic agent status: Secondary | ICD-10-CM | POA: Diagnosis not present

## 2015-07-04 DIAGNOSIS — N921 Excessive and frequent menstruation with irregular cycle: Secondary | ICD-10-CM | POA: Insufficient documentation

## 2015-07-04 DIAGNOSIS — N939 Abnormal uterine and vaginal bleeding, unspecified: Secondary | ICD-10-CM | POA: Diagnosis present

## 2015-07-04 HISTORY — DX: Abnormal levels of other serum enzymes: R74.8

## 2015-07-04 HISTORY — DX: Personal history of Methicillin resistant Staphylococcus aureus infection: Z86.14

## 2015-07-04 HISTORY — DX: Unspecified tear of unspecified meniscus, current injury, unspecified knee, initial encounter: S83.209A

## 2015-07-04 HISTORY — PX: DILATATION & CURETTAGE/HYSTEROSCOPY WITH MYOSURE: SHX6511

## 2015-07-04 LAB — POCT PREGNANCY, URINE: PREG TEST UR: NEGATIVE

## 2015-07-04 SURGERY — DILATATION & CURETTAGE/HYSTEROSCOPY WITH MYOSURE
Anesthesia: General

## 2015-07-04 MED ORDER — FENTANYL CITRATE (PF) 100 MCG/2ML IJ SOLN
25.0000 ug | INTRAMUSCULAR | Status: DC | PRN
  Administered 2015-07-04 (×2): 25 ug via INTRAVENOUS

## 2015-07-04 MED ORDER — DEXAMETHASONE SODIUM PHOSPHATE 10 MG/ML IJ SOLN
INTRAMUSCULAR | Status: DC | PRN
  Administered 2015-07-04: 4 mg via INTRAVENOUS

## 2015-07-04 MED ORDER — PROPOFOL 10 MG/ML IV BOLUS
INTRAVENOUS | Status: DC | PRN
  Administered 2015-07-04: 150 mg via INTRAVENOUS
  Administered 2015-07-04: 30 mg via INTRAVENOUS

## 2015-07-04 MED ORDER — FENTANYL CITRATE (PF) 100 MCG/2ML IJ SOLN
INTRAMUSCULAR | Status: AC
  Administered 2015-07-04: 25 ug via INTRAVENOUS
  Filled 2015-07-04: qty 2

## 2015-07-04 MED ORDER — MIDAZOLAM HCL 2 MG/2ML IJ SOLN
INTRAMUSCULAR | Status: DC | PRN
  Administered 2015-07-04: 2 mg via INTRAVENOUS

## 2015-07-04 MED ORDER — SILVER NITRATE-POT NITRATE 75-25 % EX MISC
CUTANEOUS | Status: AC
  Filled 2015-07-04: qty 4

## 2015-07-04 MED ORDER — ONDANSETRON HCL 4 MG/2ML IJ SOLN
4.0000 mg | Freq: Once | INTRAMUSCULAR | Status: AC | PRN
  Administered 2015-07-04: 4 mg via INTRAVENOUS

## 2015-07-04 MED ORDER — OXYCODONE HCL 5 MG PO TABS: 5.0000 mg | ORAL_TABLET | ORAL | Status: DC | PRN

## 2015-07-04 MED ORDER — LIDOCAINE HCL (CARDIAC) 20 MG/ML IV SOLN
INTRAVENOUS | Status: DC | PRN
  Administered 2015-07-04: 80 mg via INTRAVENOUS

## 2015-07-04 MED ORDER — ONDANSETRON HCL 4 MG/2ML IJ SOLN
INTRAMUSCULAR | Status: AC
  Administered 2015-07-04: 4 mg via INTRAVENOUS
  Filled 2015-07-04: qty 2

## 2015-07-04 MED ORDER — FAMOTIDINE 20 MG PO TABS
ORAL_TABLET | ORAL | Status: AC
  Administered 2015-07-04: 20 mg via ORAL
  Filled 2015-07-04: qty 1

## 2015-07-04 MED ORDER — FAMOTIDINE 20 MG PO TABS
20.0000 mg | ORAL_TABLET | Freq: Once | ORAL | Status: AC
  Administered 2015-07-04: 20 mg via ORAL

## 2015-07-04 MED ORDER — FENTANYL CITRATE (PF) 100 MCG/2ML IJ SOLN
INTRAMUSCULAR | Status: DC | PRN
Start: 2015-07-04 — End: 2015-07-04
  Administered 2015-07-04 (×2): 50 ug via INTRAVENOUS

## 2015-07-04 MED ORDER — KETOROLAC TROMETHAMINE 30 MG/ML IJ SOLN
INTRAMUSCULAR | Status: DC | PRN
  Administered 2015-07-04: 30 mg via INTRAVENOUS

## 2015-07-04 MED ORDER — LACTATED RINGERS IV SOLN
INTRAVENOUS | Status: DC
  Administered 2015-07-04: 13:00:00 via INTRAVENOUS

## 2015-07-04 MED ORDER — ONDANSETRON HCL 4 MG/2ML IJ SOLN
INTRAMUSCULAR | Status: DC | PRN
  Administered 2015-07-04: 4 mg via INTRAVENOUS

## 2015-07-04 SURGICAL SUPPLY — 24 items
ABLATOR ENDOMETRIAL MYOSURE (ABLATOR) IMPLANT
BAG COUNTER SPONGE EZ (MISCELLANEOUS) ×2 IMPLANT
CANISTER SUC SOCK COL 7IN (MISCELLANEOUS) ×3 IMPLANT
CATH ROBINSON RED A/P 16FR (CATHETERS) ×3 IMPLANT
COUNTER SPONGE BAG EZ (MISCELLANEOUS) ×1
GLOVE BIO SURGEON STRL SZ8 (GLOVE) ×3 IMPLANT
GOWN STRL REUS W/ TWL LRG LVL3 (GOWN DISPOSABLE) ×1 IMPLANT
GOWN STRL REUS W/ TWL XL LVL3 (GOWN DISPOSABLE) ×1 IMPLANT
GOWN STRL REUS W/TWL LRG LVL3 (GOWN DISPOSABLE) ×2
GOWN STRL REUS W/TWL XL LVL3 (GOWN DISPOSABLE) ×2
MYOSURE LITE POLYP REMOVAL (MISCELLANEOUS) ×3 IMPLANT
PACK DNC HYST (MISCELLANEOUS) ×3 IMPLANT
PAD GROUND ADULT SPLIT (MISCELLANEOUS) ×3 IMPLANT
PAD OB MATERNITY 4.3X12.25 (PERSONAL CARE ITEMS) ×3 IMPLANT
PAD PREP 24X41 OB/GYN DISP (PERSONAL CARE ITEMS) ×3 IMPLANT
SOL .9 NS 3000ML IRR  AL (IV SOLUTION) ×2
SOL .9 NS 3000ML IRR UROMATIC (IV SOLUTION) ×1 IMPLANT
SPONGE XRAY 4X4 16PLY STRL (MISCELLANEOUS) ×3 IMPLANT
STRAP SAFETY BODY (MISCELLANEOUS) ×3 IMPLANT
SUT VIC AB 0 CT1 36 (SUTURE) ×3 IMPLANT
TOWEL OR 17X26 4PK STRL BLUE (TOWEL DISPOSABLE) ×3 IMPLANT
TUBING CONNECTING 10 (TUBING) ×2 IMPLANT
TUBING CONNECTING 10' (TUBING) ×1
TUBING HYSTEROSCOPY DOLPHIN (MISCELLANEOUS) ×3 IMPLANT

## 2015-07-04 NOTE — H&P (Signed)
History and Physical Interval Note:  07/04/2015 1:31 PM  Toni Carter  has presented today for surgery, with the diagnosis of MENOMETRORRHAGIA, POSTPARTUM and UNRESPONSIVE TO PROVERA and OCPs; The various methods of treatment have been discussed with the patient and family. After consideration of risks, benefits and other options for treatment, the patient has consented to  Procedure(s): [place] (N/A) as a surgical intervention .  The patient's history has been reviewed, patient examined, no change in status, stable for surgery.  Pt has the following beta blocker history-  Not taking Beta Blocker.  I have reviewed the patient's chart and labs.  Questions were answered to the patient's satisfaction.       [name] Eddie Dibbles

## 2015-07-04 NOTE — Transfer of Care (Signed)
Immediate Anesthesia Transfer of Care Note  Patient: Toni Carter  Procedure(s) Performed: Procedure(s): DILATATION & CURETTAGE/HYSTEROSCOPY WITH MYOSURE (N/A)  Patient Location: PACU  Anesthesia Type:General  Level of Consciousness: sedated  Airway & Oxygen Therapy: Patient Spontanous Breathing and Patient connected to nasal cannula oxygen  Post-op Assessment: Report given to RN and Post -op Vital signs reviewed and stable  Post vital signs: Reviewed and stable  Last Vitals:  Filed Vitals:   07/04/15 1220  BP: 119/71  Pulse: 75  Temp: 36.8 C  Resp: 20    Complications: No apparent anesthesia complications

## 2015-07-04 NOTE — Anesthesia Postprocedure Evaluation (Signed)
Anesthesia Post Note  Patient: Toni Carter  Procedure(s) Performed: Procedure(s) (LRB): DILATATION & CURETTAGE/HYSTEROSCOPY WITH MYOSURE (N/A)  Patient location during evaluation: PACU Anesthesia Type: General Level of consciousness: awake and alert Pain management: pain level controlled Vital Signs Assessment: post-procedure vital signs reviewed and stable Respiratory status: spontaneous breathing and respiratory function stable Cardiovascular status: stable Anesthetic complications: no    Last Vitals:  Filed Vitals:   07/04/15 1512 07/04/15 1520  BP: 113/69 121/60  Pulse: 58 66  Temp: 36.1 C 35.9 C  Resp: 14 16    Last Pain:  Filed Vitals:   07/04/15 1523  PainSc: 0-No pain                 [name] K

## 2015-07-04 NOTE — Anesthesia Preprocedure Evaluation (Signed)
Anesthesia Evaluation  Patient identified by MRN, date of birth, ID band Patient awake    Reviewed: Allergy & Precautions, NPO status , Patient's Chart, lab work & pertinent test results  History of Anesthesia Complications Negative for: history of anesthetic complications  Airway Mallampati: II       Dental  (+) Teeth Intact   Pulmonary neg pulmonary ROS,           Cardiovascular negative cardio ROS       Neuro/Psych negative neurological ROS  negative psych ROS   GI/Hepatic negative GI ROS, Neg liver ROS, Elevated LFT   Endo/Other  negative endocrine ROS  Renal/GU negative Renal ROS     Musculoskeletal   Abdominal   Peds  Hematology negative hematology ROS (+)   Anesthesia Other Findings   Reproductive/Obstetrics                             Anesthesia Physical Anesthesia Plan  ASA: II  Anesthesia Plan: General   Post-op Pain Management:    Induction: Intravenous  Airway Management Planned: LMA  Additional Equipment:   Intra-op Plan:   Post-operative Plan:   Informed Consent: I have reviewed the patients History and Physical, chart, labs and discussed the procedure including the risks, benefits and alternatives for the proposed anesthesia with the patient or authorized representative who has indicated his/her understanding and acceptance.     Plan Discussed with:   Anesthesia Plan Comments:         Anesthesia Quick Evaluation

## 2015-07-04 NOTE — Op Note (Signed)
Operative Note  07/04/2015  PRE-OP DIAGNOSIS: Abnormal Uterine Bleeding  POST-OP DIAGNOSIS: same   SURGEON: Barnett Applebaum, MD, FACOG  PROCEDURE: Procedure(s): DILATATION & CURETTAGE/HYSTEROSCOPY WITH MYOSURE   ANESTHESIA: Choice   ESTIMATED BLOOD LOSS: 50 mL   SPECIMENS:  E[facility], [facility]  FLUID DEFICIT: Min  COMPLICATIONS: None  DISPOSITION: PACU - hemodynamically stable.  CONDITION: stable  FINDINGS: Exam under anesthesia revealed small, mobile  uterus with no masses and bilateral adnexa without masses or fullness. Hysteroscopy revealed a  grossly normal appearing uterine cavity with some proliferative lining and areas of thickening, with bilateral tubal ostia and normal appearing endocervical canal.  PROCEDURE IN DETAIL: After informed consent was obtained, the patient was taken to the operating room where anesthesia was obtained without difficulty. The patient was positioned in the dorsal lithotomy position in [place]. The patient's bladder was catheterized with an in and out foley catheter. The patient was examined under anesthesia, with the above noted findings. The weightedspeculum was placed inside the patient's vagina, and the the anterior lip of the cervix was seen and grasped with the tenaculum.  An Endocervical specimen was obtained with a kevorkian curette. The uterine cavity was sounded to 10cm, and then the cervix was progressively dilated to a 18French-Pratt dilator. The 30 degree hysteroscope was introduced, with saline fluid used to distend the intrauterine cavity, with the above noted findings.  The hystersocope was removed and the uterine cavity was curetted until a gritty texture was noted, yielding endometrial curettings. Excellent hemostasis was noted, and all instruments were removed, with excellent hemostasis noted throughout. She was then taken out of dorsal lithotomy. Minimal discrepancy in fluid was noted.  The patient tolerated the procedure well. Sponge, lap  and needle counts were correct x2. The patient was taken to recovery room in excellent condition.

## 2015-07-04 NOTE — Anesthesia Procedure Notes (Signed)
Procedure Name: LMA Insertion Date/Time: 07/04/2015 1:43 PM Performed by: Zetta Bills Pre-anesthesia Checklist: Patient identified, Emergency Drugs available, Suction available, Patient being monitored and Timeout performed Patient Re-evaluated:Patient Re-evaluated prior to inductionOxygen Delivery Method: Circle system utilized Preoxygenation: Pre-oxygenation with 100% oxygen Intubation Type: IV induction Ventilation: Mask ventilation without difficulty LMA: LMA inserted LMA Size: 4.0 Number of attempts: 1 Placement Confirmation: positive ETCO2 and CO2 detector Tube secured with: Tape Dental Injury: Teeth and Oropharynx as per pre-operative assessment

## 2015-07-04 NOTE — Discharge Instructions (Signed)
General Gynecological Post-Operative Instructions °You may expect to feel dizzy, weak, and drowsy for as long as 24 hours after receiving the medicine that made you sleep (anesthetic).  °Do not drive a car, ride a bicycle, participate in physical activities, or take public transportation until you are done taking narcotic pain medicines or as directed by your doctor.  °Do not drink alcohol or take tranquilizers.  °Do not take medicine that has not been prescribed by your doctor.  °Do not sign important papers or make important decisions while on narcotic pain medicines.  °Have a responsible person with you.  °CARE OF INCISION  °Keep incision clean and dry. °Take showers instead of baths until your doctor gives you permission to take baths.  °Avoid heavy lifting (more than 10 pounds/4.5 kilograms), pushing, or pulling.  °Avoid activities that may risk injury to your surgical site.  °No sexual intercourse or placement of anything in the vagina for 2 weeks or as instructed by your doctor. °If you have tubes coming from the wound site, check with your doctor regarding appropriate care of the tubes. °Only take prescription or over-the-counter medicines  for pain, discomfort, or fever as directed by your doctor. Do not take aspirin. It can make you bleed. Take medicines (antibiotics) that kill germs if they are prescribed for you.  °Call the office or go to the ER if:  °You feel sick to your stomach (nauseous) and you start to throw up (vomit).  °You have trouble eating or drinking.  °You have an oral temperature above 101.  °You have constipation that is not helped by adjusting diet or increasing fluid intake. Pain medicines are a common cause of constipation.  °You have any other concerns. °SEEK IMMEDIATE MEDICAL CARE IF:  °You have persistent dizziness.  °You have difficulty breathing or a congested sounding (croupy) cough.  °You have an oral temperature above 102.5, not controlled by medicine.  °There is increasing  pain or tenderness near or in the surgical site.  ° ° ° °

## 2015-07-09 LAB — SURGICAL PATHOLOGY

## 2015-09-21 IMAGING — US US OB FOLLOW-UP
1 series · 14 of 28 positions shown · non-contrast
Comparison: none

[Series 1: us ob follow-up · 0.26mm/px · 14 of 98 slices shown]
[im 4/98]
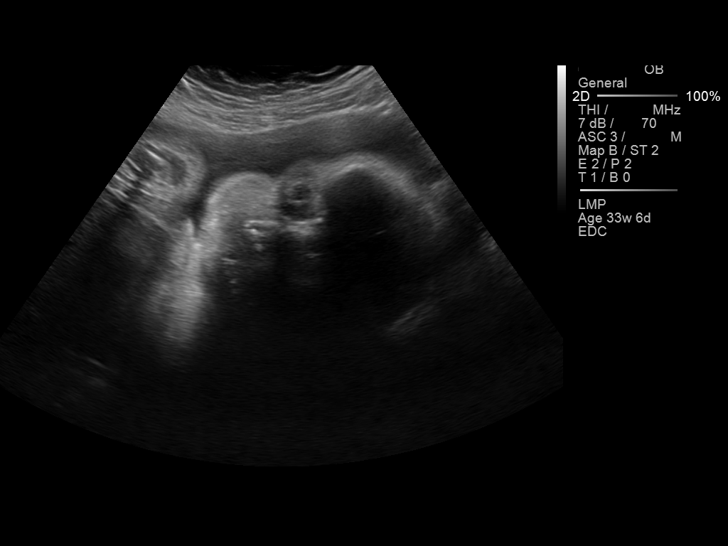
[im 11/98]
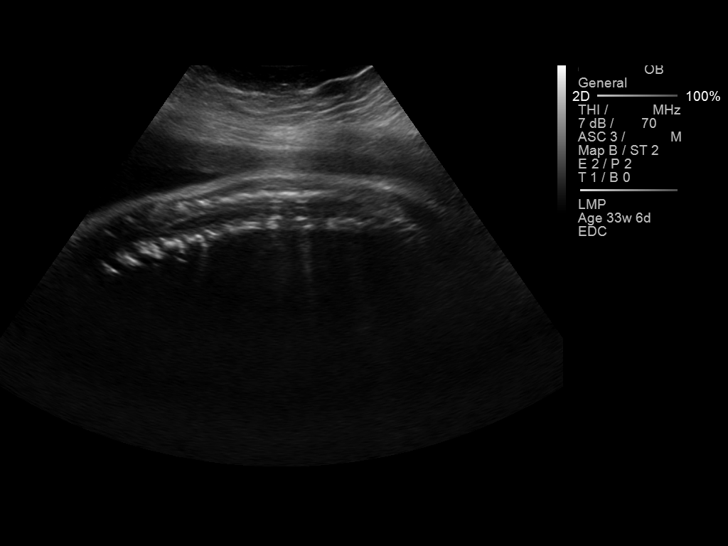
[im 18/98]
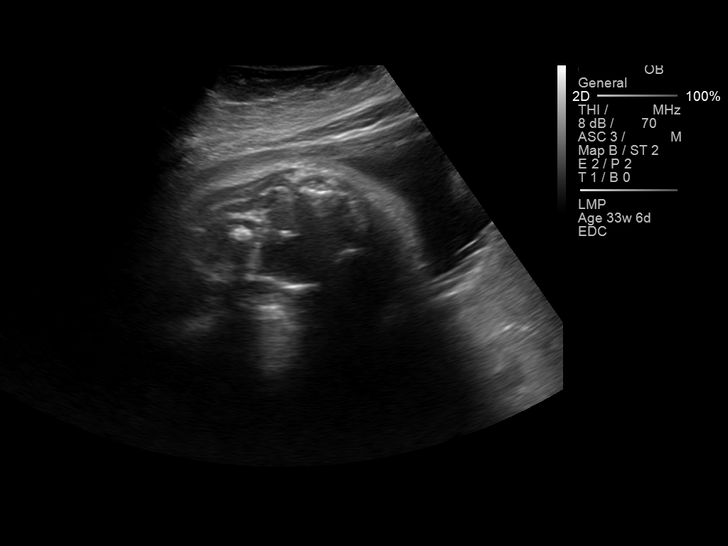
[im 26/98]
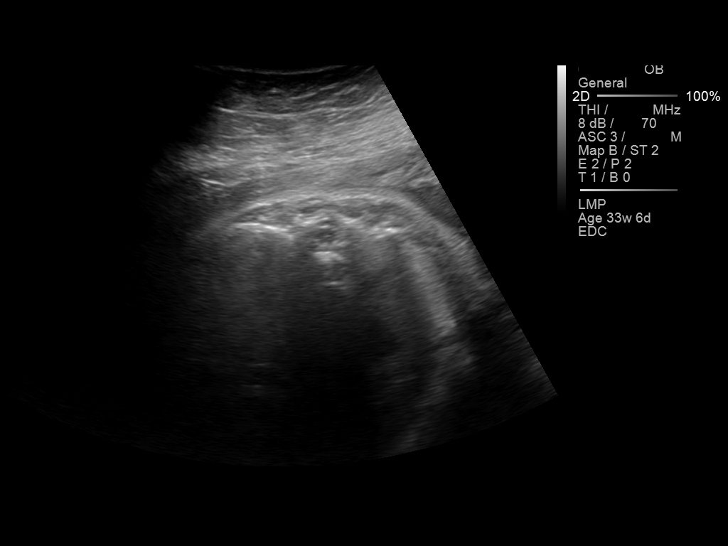
[im 33/98]
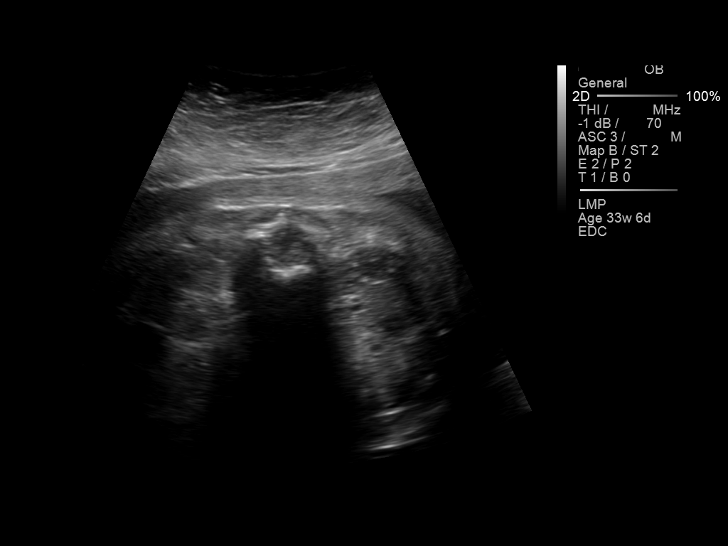
[im 40/98]
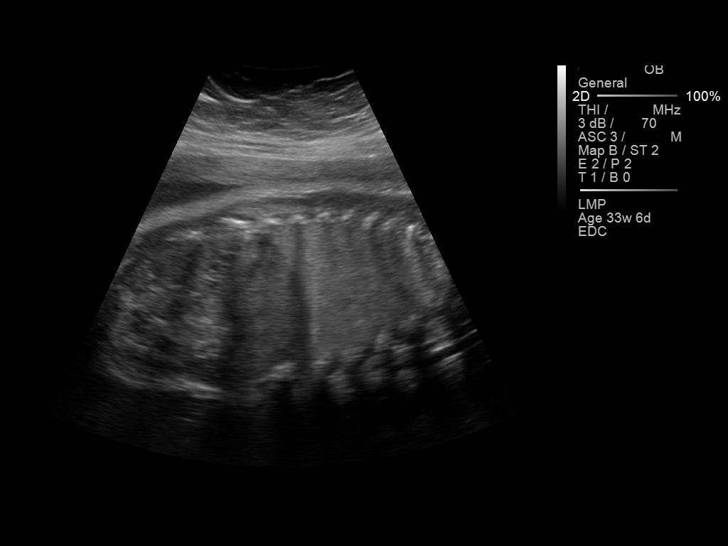
[im 47/98]
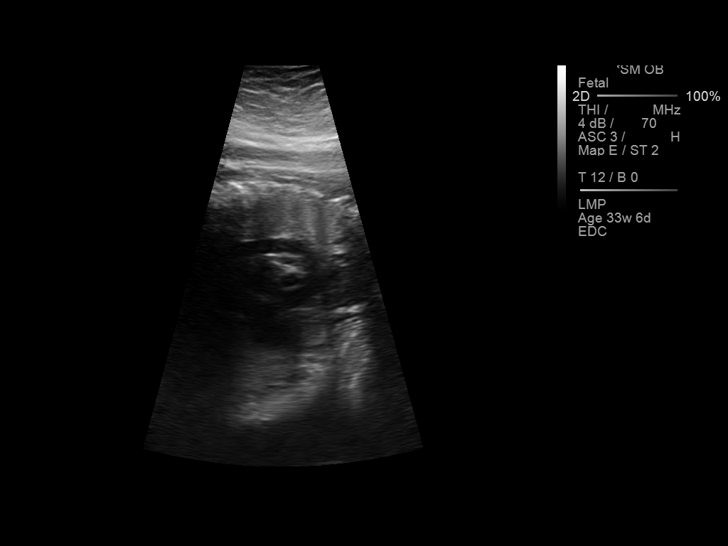
[im 54/98]
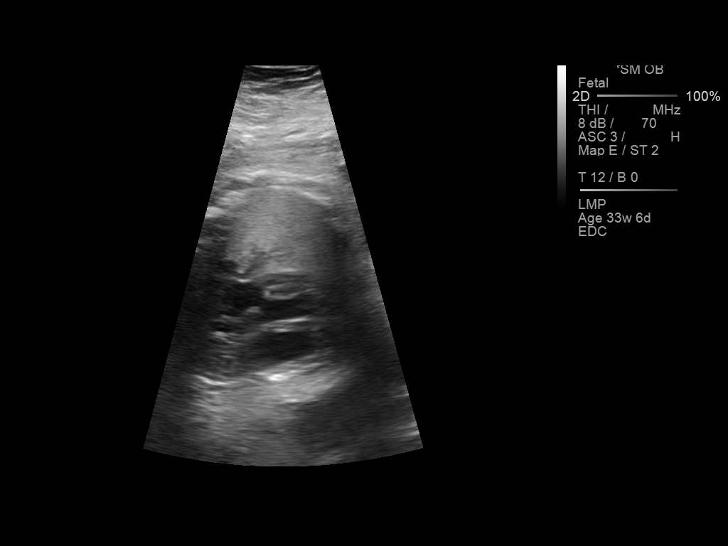
[im 62/98]
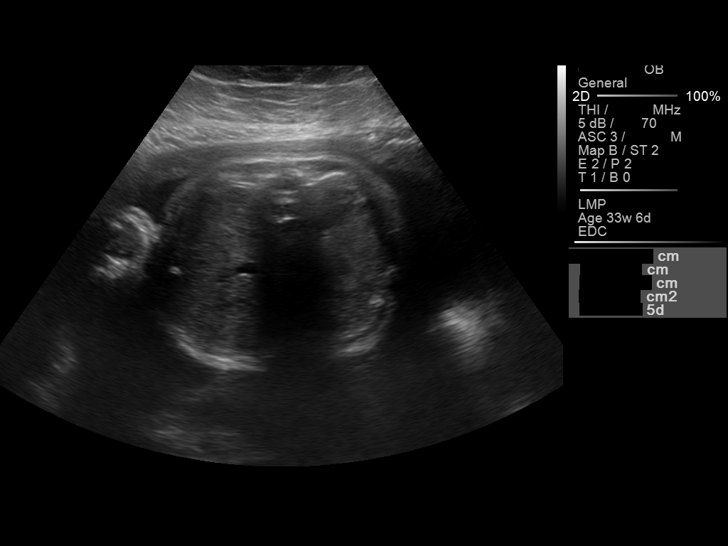
[im 69/98]
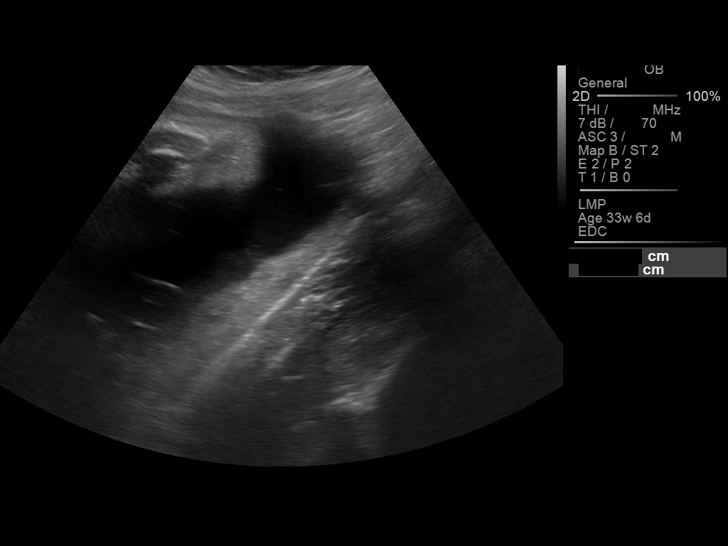
[im 76/98]
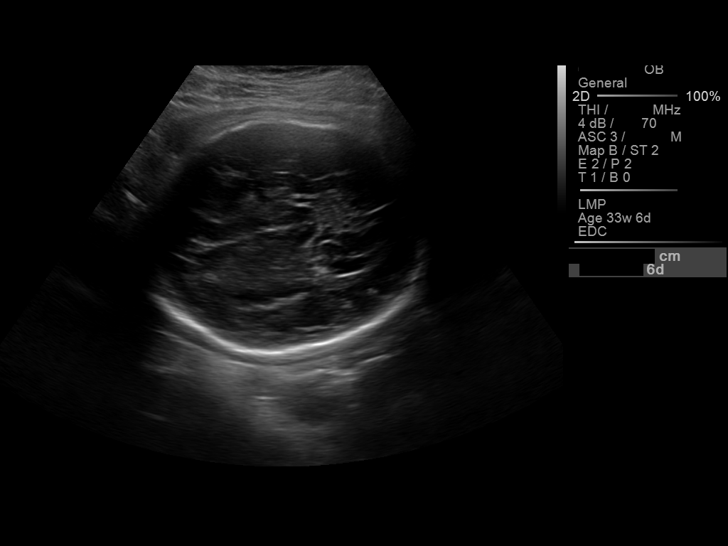
[im 83/98]
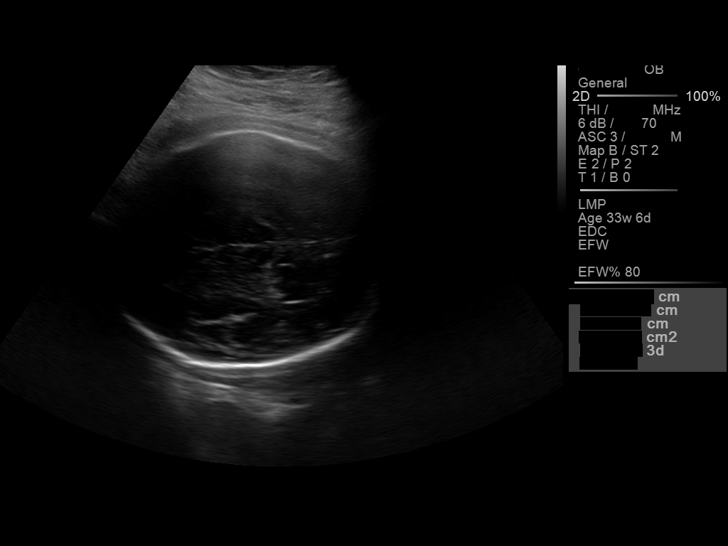
[im 90/98]
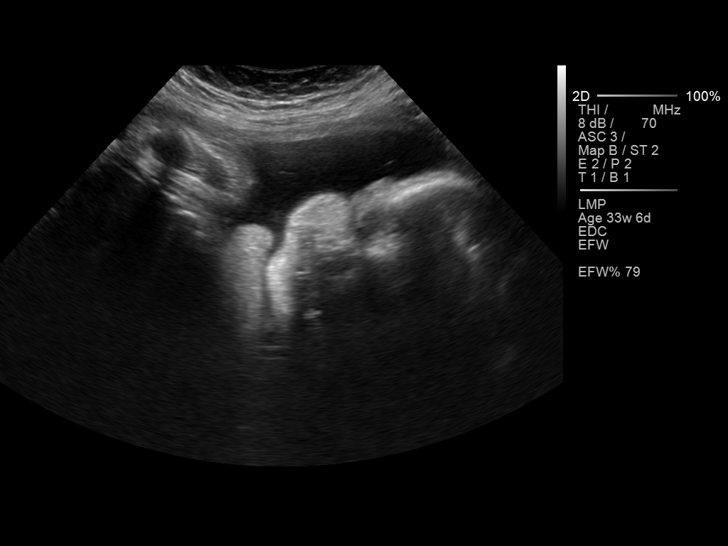
[im 98/98]
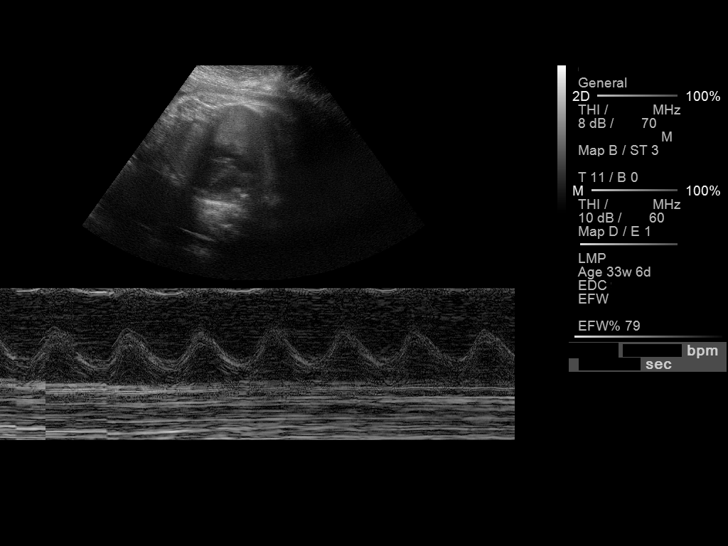

[14 of 28 positions shown; findings below may reference images not displayed]

Canned report from images found in remote index.

Refer to host system for actual result text.

## 2016-01-04 ENCOUNTER — Emergency Department
Admission: EM | Admit: 2016-01-04 | Discharge: 2016-01-04 | Disposition: A | Discharge status: Home / Self Care | Payer: Self-pay | Attending: Emergency Medicine | Admitting: Emergency Medicine

## 2016-01-04 ENCOUNTER — Emergency Department: Payer: Self-pay

## 2016-01-04 DIAGNOSIS — W231XXA Caught, crushed, jammed, or pinched between stationary objects, initial encounter: Secondary | ICD-10-CM | POA: Insufficient documentation

## 2016-01-04 DIAGNOSIS — Y9389 Activity, other specified: Secondary | ICD-10-CM | POA: Insufficient documentation

## 2016-01-04 DIAGNOSIS — S6982XA Other specified injuries of left wrist, hand and finger(s), initial encounter: Secondary | ICD-10-CM

## 2016-01-04 DIAGNOSIS — Y999 Unspecified external cause status: Secondary | ICD-10-CM | POA: Insufficient documentation

## 2016-01-04 DIAGNOSIS — S61307A Unspecified open wound of left little finger with damage to nail, initial encounter: Secondary | ICD-10-CM | POA: Insufficient documentation

## 2016-01-04 DIAGNOSIS — Y929 Unspecified place or not applicable: Secondary | ICD-10-CM | POA: Insufficient documentation

## 2016-01-04 MED ORDER — CEPHALEXIN 500 MG PO CAPS: 500.0000 mg | ORAL_CAPSULE | Freq: Four times a day (QID) | ORAL | Status: DC

## 2016-01-04 MED ORDER — OXYCODONE-ACETAMINOPHEN 5-325 MG PO TABS: 1.0000 | ORAL_TABLET | Freq: Four times a day (QID) | ORAL | Status: DC | PRN

## 2016-01-04 NOTE — ED Provider Notes (Signed)
[facility] Emergency Department Provider Note  ____________________________________________  Time seen: Approximately 4:45 PM  I have reviewed the triage vital signs and the nursing notes.   HISTORY  Chief Complaint Hand Pain    HPI Toni Carter is a 25 y.o. female who presents emergency department complaining of pain to the fifth digit of the left hand. Patient states that she was picking up a bag when her fingernail got caught and was peeled up and backwards. Patient states that the fingernail is loose but has remained attached. She is concerned because there is some mild yellow crusting underneath the fingernail. Area is tender to palpation. Patient immediately wrapped the finger after injury to prevent further movement. Patient denies any other injury or complaint.   Past Medical History  Diagnosis Date  . Headache   . Hx MRSA infection   . Elevated liver enzymes     liver panel normal on 07-02-15  . Torn meniscus     left     Patient Active Problem List   Diagnosis Date Noted  . Abnormal uterine bleeding 07/04/2015  . Postpartum care following vaginal delivery 03/19/2015    Past Surgical History  Procedure Laterality Date  . Tubal ligation Bilateral 03/21/2015    Procedure: POST PARTUM TUBAL LIGATION;  Surgeon: Malachy Mood, MD;  Location: [facility] ORS;  Service: Gynecology;  Laterality: Bilateral;  . Dilatation & curettage/hysteroscopy with myosure N/A 07/04/2015    Procedure: DILATATION & CURETTAGE/HYSTEROSCOPY WITH MYOSURE;  Surgeon: Gae Dry, MD;  Location: [facility] ORS;  Service: Gynecology;  Laterality: N/A;    Current Outpatient Rx  Name  Route  Sig  Dispense  Refill  . cephALEXin (KEFLEX) 500 MG capsule   Oral   Take 1 capsule (500 mg total) by mouth 4 (four) times daily.   28 capsule   0   . ibuprofen (ADVIL,MOTRIN) 600 MG tablet   Oral   Take 1 tablet (600 mg total) by mouth every 6 (six) hours as needed.   50 tablet  1   . Multiple Vitamin (MULTIVITAMIN) tablet   Oral   Take 1 tablet by mouth daily.         Marland Kitchen oxyCODONE-acetaminophen (ROXICET) 5-325 MG tablet   Oral   Take 1 tablet by mouth every 6 (six) hours as needed for severe pain.   10 tablet   0     Allergies Morphine and related; Hydrocodone; Tylenol; Tape; and Vicodin  No family history on file.  Social History Social History  Substance Use Topics  . Smoking status: Never Smoker   . Smokeless tobacco: Never Used  . Alcohol Use: Yes     Comment: RARE     Review of Systems  Constitutional: No fever/chills Eyes: No visual changes.  Cardiovascular: no chest pain. Respiratory: no cough. No SOB. Musculoskeletal: Negative for musculoskeletal pain.Positive for fifth digit pain to the right finger Skin: Negative for rash, abrasions, lacerations, ecchymosis. Neurological: Negative for headaches, focal weakness or numbness. 10-point ROS otherwise negative.  ____________________________________________   PHYSICAL EXAM:  VITAL SIGNS: ED Triage Vitals  Enc Vitals Group     BP 01/04/16 1430 128/72 mmHg     Pulse Rate 01/04/16 1430 81     Resp 01/04/16 1430 20     Temp 01/04/16 1430 98.4 F (36.9 C)     Temp Source 01/04/16 1430 Oral     SpO2 01/04/16 1430 99 %     Weight 01/04/16 1430 230 lb (104.327 kg)  Height 01/04/16 1430 5\' 10"  (1.778 m)     Head Cir --      Peak Flow --      Pain Score 01/04/16 1430 9     Pain Loc --      Pain Edu? --      Excl. in [place]? --      Constitutional: Alert and oriented. Well appearing and in no acute distress. Eyes: Conjunctivae are normal. PERRL. EOMI. Head: Atraumatic. Cardiovascular: Normal rate, regular rhythm. Normal S1 and S2.  Good peripheral circulation. Respiratory: Normal respiratory effort without tachypnea or retractions. Lungs CTAB. Good air entry to the bases with no decreased or absent breath sounds. Musculoskeletal: Full range of motion to all extremities. No gross  deformities appreciated. No visible deformity noted to fifth digit of the left hand upon inspection. Peripheral visualization there is mild edema noted around the nailbed. Nail is in place. It is slightly loose but does not easily left frontal nailbed. No bleeding at this time. There is mild golden Color crust on the underside of the nail. No drainage at this time. No bleeding. No subungual hematoma Neurologic:  Normal speech and language. No gross focal neurologic deficits are appreciated.  Skin:  Skin is warm, dry and intact. No rash noted. Psychiatric: Mood and affect are normal. Speech and behavior are normal. Patient exhibits appropriate insight and judgement.   ____________________________________________   LABS (all labs ordered are listed, but only abnormal results are displayed)  Labs Reviewed - No data to display ____________________________________________  EKG   ____________________________________________  RADIOLOGY Diamantina Providence Jahvier Aldea, personally viewed and evaluated these images (plain radiographs) as part of my medical decision making, as well as reviewing the written report by the radiologist.  Dg Hand Complete Left  01/04/2016  CLINICAL DATA:  Slammed finger onto something. Pain and redness to the fifth digit. EXAM: LEFT HAND - COMPLETE 3+ VIEW COMPARISON:  None. FINDINGS: There is no evidence of fracture or dislocation. There is no evidence of arthropathy or other focal bone abnormality. Soft tissues are unremarkable. IMPRESSION: Negative. Electronically Signed   By: Kerby Moors M.D.   On: 01/04/2016 15:30    ____________________________________________    PROCEDURES  Procedure(s) performed:     SPLINT APPLICATION Date/Time: 99991111 PM Authorized by: Darletta Moll Consent: Verbal consent obtained. Risks and benefits: risks, benefits and alternatives were discussed Consent given by: patient Splint applied by: Betha Loa, PA-C Location  details: 5th digit L hand Splint type: Finger splint  Supplies used: Prefabricated finger splint  Post-procedure: The splinted body part was neurovascularly unchanged following the procedure. Patient tolerance: Patient tolerated the procedure well with no immediate complications.     Medications - No data to display   ____________________________________________   INITIAL IMPRESSION / ASSESSMENT AND PLAN / ED COURSE  Pertinent labs & imaging results that were available during my care of the patient were reviewed by me and considered in my medical decision making (see chart for details).  Patient's diagnosis is consistent with nailbed injury. This occurred 2 days prior. Exam is reassuring at this time. X-ray reveals no osseous abnormality. Patient is given options of fingernail removal in the emergency department versus securing fingernail. Patient opts to not remove the fingernail at this time. Patient's nail is secured using Dermabond around nailbed. Finger is splinted to prevent further injury. Patient will be covered with antibiotics due to the nature of injury. Strict precautions are given to patient to seek medical care should symptoms worsen.Marland Kitchen  Patient will be discharged home with prescriptions for antibiotics and limited pain medication. Patient is to follow up with primary care provider as needed or otherwise directed. Patient is given ED precautions to return to the ED for any worsening or new symptoms.     ____________________________________________  FINAL CLINICAL IMPRESSION(S) / ED DIAGNOSES  Final diagnoses:  Nail bed injury, left, initial encounter      NEW MEDICATIONS STARTED DURING THIS VISIT:  Discharge Medication List as of 01/04/2016  4:55 PM    START taking these medications   Details  cephALEXin (KEFLEX) 500 MG capsule Take 1 capsule (500 mg total) by mouth 4 (four) times daily., Starting 01/04/2016, Until Discontinued, Print    oxyCODONE-acetaminophen  (ROXICET) 5-325 MG tablet Take 1 tablet by mouth every 6 (six) hours as needed for severe pain., Starting 01/04/2016, Until Discontinued, Print            This chart was dictated using voice recognition software/Dragon. Despite best efforts to proofread, errors can occur which can change the meaning. Any change was purely unintentional.    Darletta Moll, PA-C 01/04/16 [place], MD 01/04/16 2055

## 2016-01-04 NOTE — ED Notes (Signed)
States she caught her finger while picking up a bag  Swelling noted to left 5 th finger and nailbed is loose

## 2016-01-04 NOTE — Discharge Instructions (Signed)
Nail Bed Injury The nail bed is the soft tissue under a fingernail or toenail that is the origin for new nail growth. Various types of injuries can occur at the nail bed. These injuries may involve bruising or bleeding under the nail, cuts (lacerations) in the nail or nail bed, or loss of a part of the nail or the whole nail (avulsion). In some cases, a nail bed injury accompanies another injury, such as a break (fracture) of the bone at the tip of the finger or toe. Nail bed injuries are common in people who have jobs that require performing manual tasks with their hands, such as carpenters and landscapers.  The nail bed includes the growth center of the nail. If this growth center is damaged, the injured nail may not grow back normally if at all. The regrown nail might have an abnormal shape or appearance. It can take several months for a damaged or torn-off nail to regrow. Depending on the nature and extent of the nail bed injury, there may be a permanent disruption of normal nail growth. CAUSES  Damage to the nail bed area is usually caused by crushing, pinching, cutting, or tearing injuries of the fingertip or toe. For example, these injuries may occur when a fingertip gets caught in a door, hit by a hammer, or damaged in accidents involving electrical tools or power machinery.  SYMPTOMS  Symptoms vary depending on the nature of the injury. Symptoms may include:  Pain in the injured area.  Bleeding.  Swelling.  Discoloration.  Collection of blood under the nail (hematoma).  Deformed or split nail.  Loose nail (not stuck to the nail bed).  Loss of all or part of the nail. DIAGNOSIS  Your caregiver will take a medical history and examine the injured area. You will be asked to describe how the injury occurred. X-rays may be done to see if you have a fracture. Your caregiver might also check for conditions that may affect healing, such as diabetes, nerve problems, or poor circulation.    TREATMENT  Treatment depends on the type of injury.  The injury may not require any special treatment other than keeping the area clean and free of infection.   Your caregiver may drain the collection of blood from under the nail. This can be done by making a small hole in the nail.   Your caregiver may remove all or part of your nail. This might be necessary to stitch (suture) any laceration in the nail bed. Before doing this, the caregiver will likely give you medication to numb the nail area (local anesthetic). In some cases, the caregiver may choose to numb the entire finger or toe (digital nerve block). Depending on the location and size of the nail bed injury, an avulsed nail is sometimes stitched back in place to provide temporary protection to the nail bed until the new nail grows in.  Your caregiver may apply bandages (dressings) or splints to the area.  You might be prescribed antibiotic medication to help prevent infection.  For certain injuries, your caregiver may direct you to see a hand or foot specialist.  You may need a tetanus shot if:  You cannot remember when you had your last tetanus shot.  You have never had a tetanus shot.  The injury broke your skin. If you get a tetanus shot, your arm may swell, get red, and feel warm to the touch. This is common and not a problem. If you need a tetanus  shot and you choose not to have one, there is a rare chance of getting tetanus. Sickness from tetanus can be serious. HOME CARE INSTRUCTIONS   Keep your hand or foot raised (elevated) to relieve pain and swelling.   For an injured toenail, lie in bed or on a couch with your leg on pillows. You can also sit in a recliner with your leg up. Avoid walking or letting your leg dangle. When you walk, wear an open-toe shoe.  For an injured fingernail, keep your hand above the level of your heart. Use pillows on a table or on the arm of your chair while sitting. Use them on your bed  while sleeping.   Keep your injury protected with dressings or splints as directed by your caregiver.   Keep any dressings clean and dry. Change or remove your dressings as directed by your caregiver.   Only take over-the-counter or prescription medications as directed by your caregiver. If you were prescribed antibiotics, take them as directed. Finish them even if you start to feel better.   Follow up with your caregiver as directed.  SEEK MEDICAL CARE IF:   You have pain that is not controlled with medication.   You have any problems caring for your injury.  SEEK IMMEDIATE MEDICAL CARE IF:   You have increased pain, drainage, or bleeding in the injured area.   You have redness, soreness, and swelling (inflammation) in the injured area.  You have a fever or persistent symptoms for more than 2-3 days.  You have a fever and your symptoms suddenly get worse.  You have swelling that spreads from your finger into your hand or from your toe into your foot.  MAKE SURE YOU:  Understand these instructions.  Will watch your condition.  Will get help right away if you are not doing well or get worse.   This information is not intended to replace advice given to you by your health care provider. Make sure you discuss any questions you have with your health care provider.   Document Released: 07/15/2004 Document Revised: 10/02/2012 Document Reviewed: 06/29/2012 Elsevier Interactive Patient Education [employer].

## 2016-02-06 IMAGING — US US PELVIS COMPLETE
1 series · 14 of 25 positions shown · non-contrast
Comparison: None

CLINICAL DATA: Initial valuation for three-month history of vaginal
bleeding.

EXAM:
TRANSABDOMINAL AND TRANSVAGINAL ULTRASOUND OF PELVIS
TECHNIQUE: Both transabdominal and transvaginal ultrasound examinations of the
pelvis were performed. Transabdominal technique was performed for
global imaging of the pelvis including uterus, ovaries, adnexal
regions, and pelvic cul-de-sac. It was necessary to proceed with
endovaginal exam following the transabdominal exam to visualize the
uterus and ovaries..

[Series 1: us pelvis complete · 0.24mm/px · 14 of 94 slices shown]
[im 1/94]
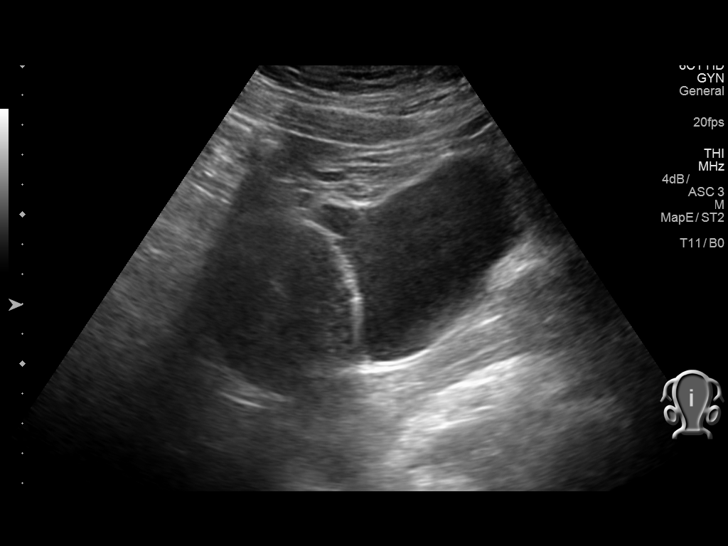
[im 8/94]
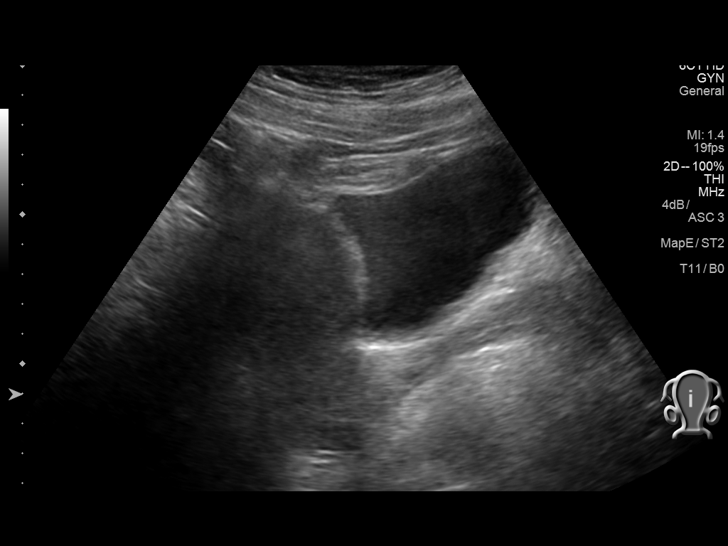
[im 16/94]
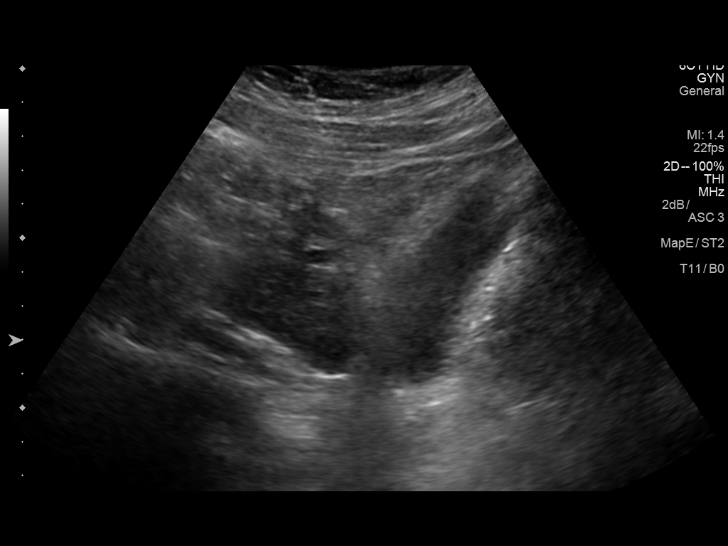
[im 24/94]
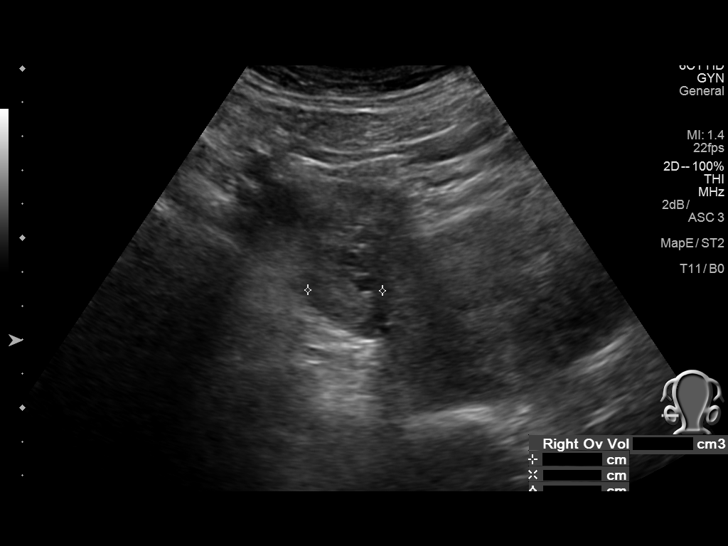
[im 32/94]
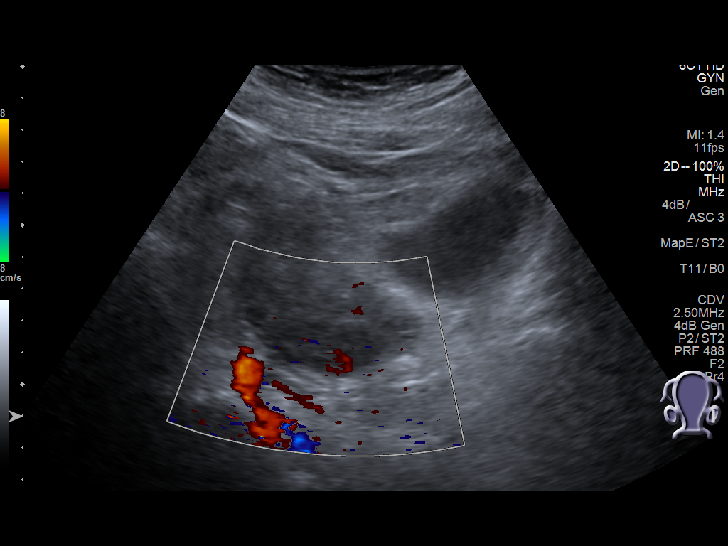
[im 35/94]
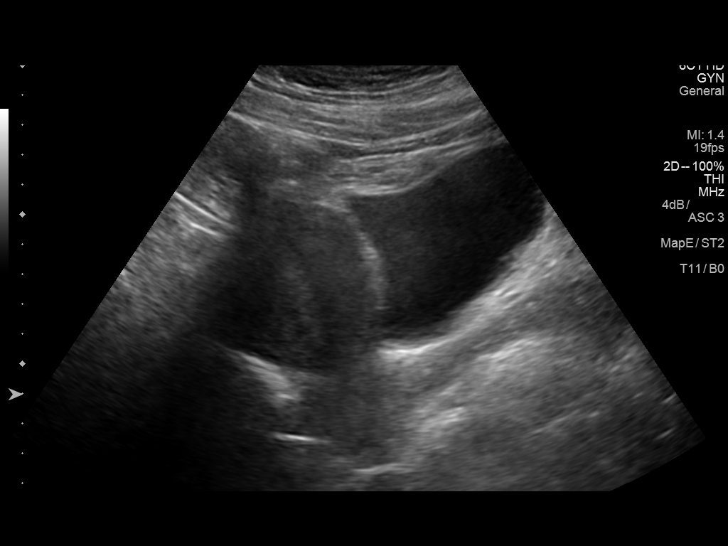
[im 43/94]
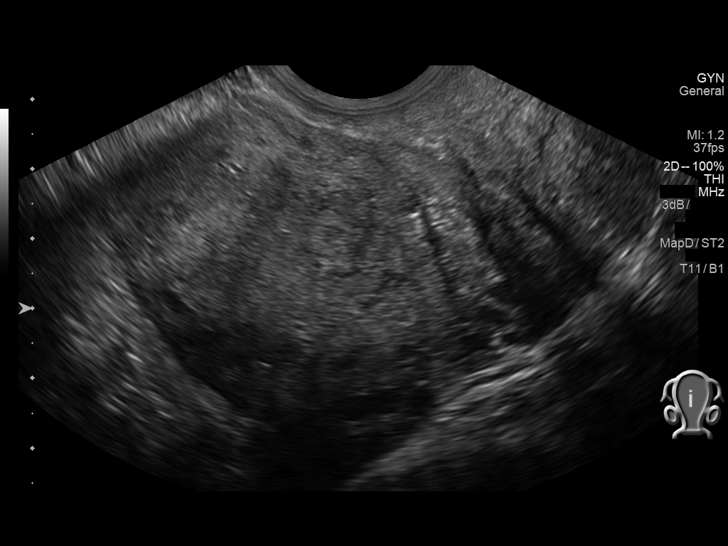
[im 51/94]
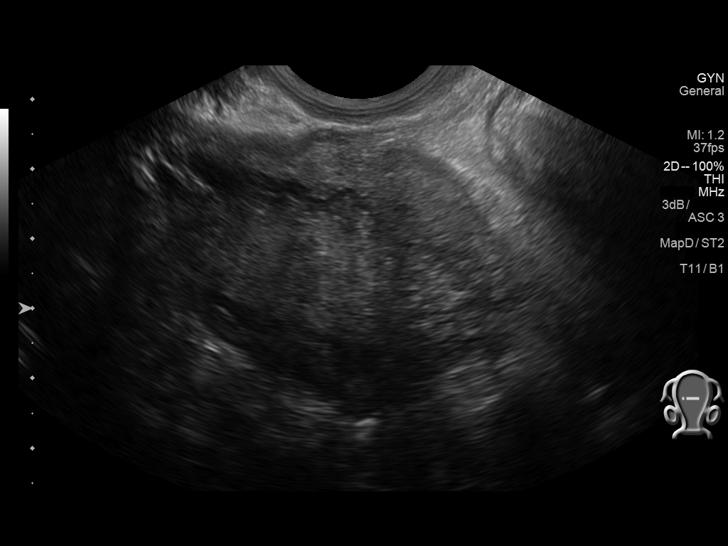
[im 59/94]
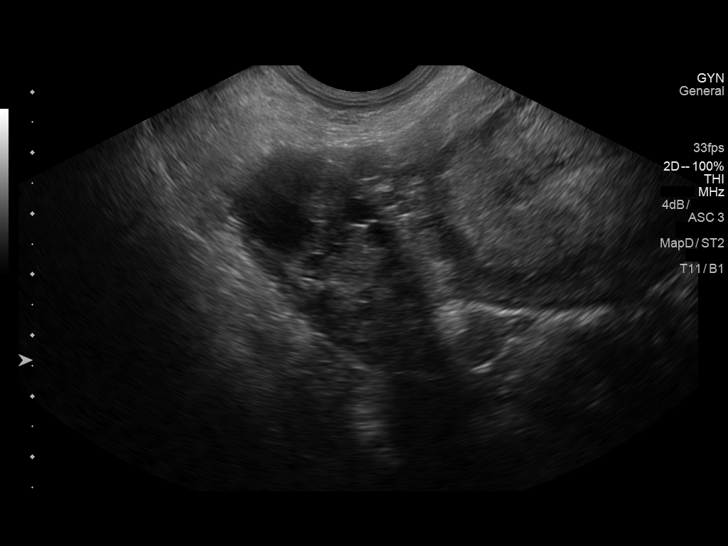
[im 63/94]
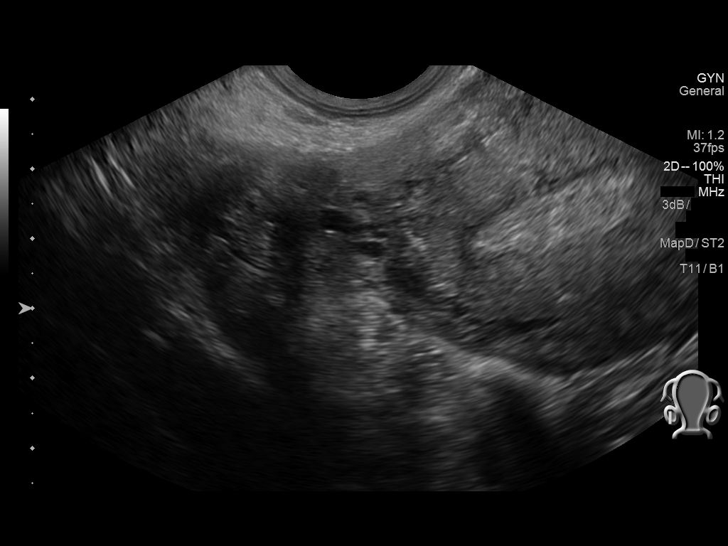
[im 70/94]
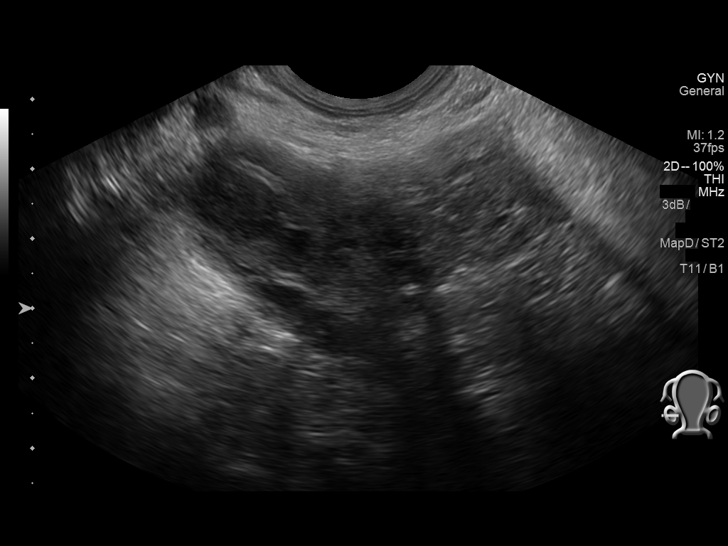
[im 78/94]
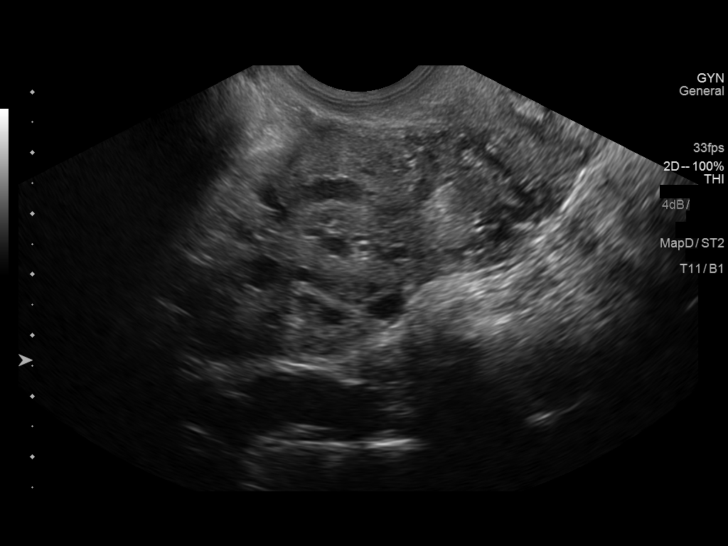
[im 86/94]
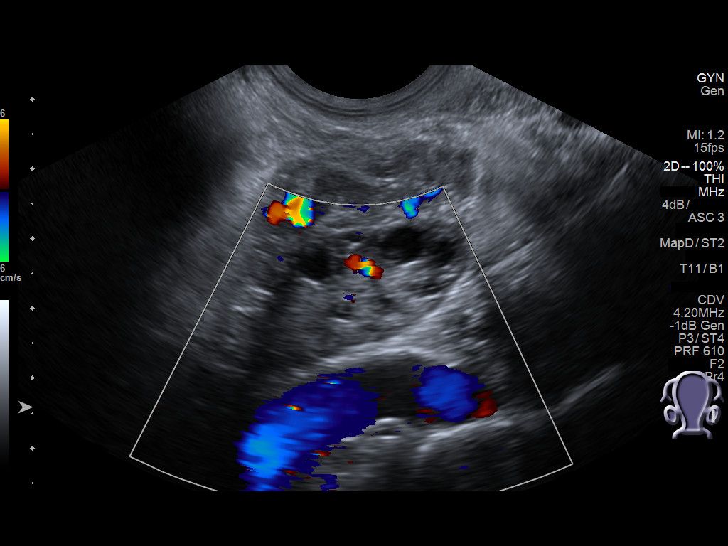
[im 94/94]
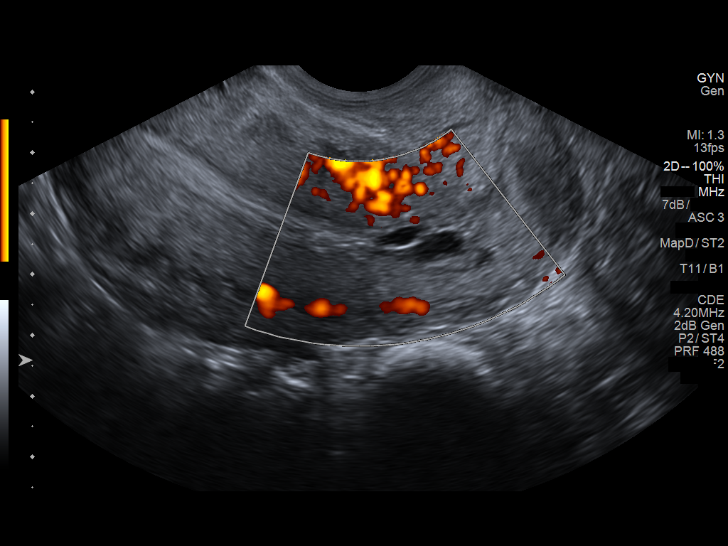

[14 of 25 positions shown; findings below may reference images not displayed]

FINDINGS: Uterus

Measurements: 9.4 x 4.5 x 5.5 cm. No fibroids or other mass
visualized. Scattered nabothian cysts noted.

Endometrium

Thickness: 8 mm.  No focal abnormality visualized.

Right ovary

Measurements: 4.7 x 2.2 x 2.7 cm. Normal appearance/no adnexal mass.
1.8 x 1.3 x 1.9 cm hypoechoic lesion within the right ovary, likely
a dominant follicle. No internal vascularity.

Left ovary

Measurements: 4.2 x 1.7 x 2.8 cm. Normal appearance/no adnexal mass.

Other findings

No abnormal free fluid.
IMPRESSION: Normal pelvic ultrasound.  No acute abnormality identified.

## 2016-04-27 DIAGNOSIS — Z79899 Other long term (current) drug therapy: Secondary | ICD-10-CM | POA: Insufficient documentation

## 2016-04-27 DIAGNOSIS — N924 Excessive bleeding in the premenopausal period: Secondary | ICD-10-CM | POA: Insufficient documentation

## 2016-04-27 DIAGNOSIS — N390 Urinary tract infection, site not specified: Secondary | ICD-10-CM | POA: Insufficient documentation

## 2016-04-27 DIAGNOSIS — R109 Unspecified abdominal pain: Secondary | ICD-10-CM | POA: Insufficient documentation

## 2016-04-27 LAB — URINALYSIS COMPLETE WITH MICROSCOPIC (ARMC ONLY)
BILIRUBIN URINE: NEGATIVE
Bacteria, UA: NONE SEEN
GLUCOSE, UA: NEGATIVE mg/dL
KETONES UR: NEGATIVE mg/dL
NITRITE: NEGATIVE
PROTEIN: 100 mg/dL — AB
SPECIFIC GRAVITY, URINE: 1.024 (ref 1.005–1.030)
pH: 6 (ref 5.0–8.0)

## 2016-04-27 LAB — CBC
HCT: 38.9 % (ref 35.0–47.0)
HEMOGLOBIN: 13.6 g/dL (ref 12.0–16.0)
MCH: 29 pg (ref 26.0–34.0)
MCHC: 34.9 g/dL (ref 32.0–36.0)
MCV: 83 fL (ref 80.0–100.0)
PLATELETS: 163 10*3/uL (ref 150–440)
RBC: 4.68 MIL/uL (ref 3.80–5.20)
RDW: 13.5 % (ref 11.5–14.5)
WBC: 10.1 10*3/uL (ref 3.6–11.0)

## 2016-04-27 LAB — BASIC METABOLIC PANEL
Anion gap: 9 (ref 5–15)
BUN: 13 mg/dL (ref 6–20)
CALCIUM: 9.2 mg/dL (ref 8.9–10.3)
CHLORIDE: 108 mmol/L (ref 101–111)
CO2: 23 mmol/L (ref 22–32)
CREATININE: 0.69 mg/dL (ref 0.44–1.00)
Glucose, Bld: 111 mg/dL — ABNORMAL HIGH (ref 65–99)
Potassium: 3.7 mmol/L (ref 3.5–5.1)
SODIUM: 140 mmol/L (ref 135–145)

## 2016-04-27 LAB — POCT PREGNANCY, URINE: Preg Test, Ur: NEGATIVE

## 2016-04-27 NOTE — ED Triage Notes (Signed)
Pt in with heavy period x 2 days and today passing heavy clots. Today started having right lower back pain, denies any abd pain.

## 2016-04-28 ENCOUNTER — Emergency Department
Admission: EM | Admit: 2016-04-28 | Discharge: 2016-04-28 | Disposition: A | Discharge status: Home / Self Care | Payer: Self-pay | Attending: Emergency Medicine | Admitting: Emergency Medicine

## 2016-04-28 ENCOUNTER — Emergency Department: Payer: Self-pay

## 2016-04-28 DIAGNOSIS — R109 Unspecified abdominal pain: Secondary | ICD-10-CM

## 2016-04-28 DIAGNOSIS — N390 Urinary tract infection, site not specified: Secondary | ICD-10-CM

## 2016-04-28 MED ORDER — CEFTRIAXONE SODIUM-DEXTROSE 1-3.74 GM-% IV SOLR
1.0000 g | INTRAVENOUS | Status: DC
  Administered 2016-04-28: 1 g via INTRAVENOUS
  Filled 2016-04-28: qty 50

## 2016-04-28 MED ORDER — CEPHALEXIN 500 MG PO CAPS: 500.0000 mg | ORAL_CAPSULE | Freq: Four times a day (QID) | ORAL | 0 refills | Status: DC

## 2016-04-28 MED ORDER — IBUPROFEN 800 MG PO TABS: 800.0000 mg | ORAL_TABLET | Freq: Three times a day (TID) | ORAL | 0 refills | Status: DC | PRN

## 2016-04-28 MED ORDER — OXYCODONE-ACETAMINOPHEN 5-325 MG PO TABS
1.0000 | ORAL_TABLET | Freq: Once | ORAL | Status: AC
  Administered 2016-04-28: 1 via ORAL
  Filled 2016-04-28: qty 1

## 2016-04-28 MED ORDER — ONDANSETRON 4 MG PO TBDP: 4.0000 mg | ORAL_TABLET | Freq: Three times a day (TID) | ORAL | 0 refills | Status: DC | PRN

## 2016-04-28 MED ORDER — KETOROLAC TROMETHAMINE 30 MG/ML IJ SOLN
10.0000 mg | Freq: Once | INTRAMUSCULAR | Status: AC
  Administered 2016-04-28: 9.9 mg via INTRAVENOUS
  Filled 2016-04-28: qty 1

## 2016-04-28 MED ORDER — DEXTROSE 5 % IV SOLN: 1.0000 g | INTRAVENOUS | Status: DC

## 2016-04-28 MED ORDER — SODIUM CHLORIDE 0.9 % IV BOLUS (SEPSIS)
1000.0000 mL | Freq: Once | INTRAVENOUS | Status: AC
  Administered 2016-04-28: 1000 mL via INTRAVENOUS

## 2016-04-28 MED ORDER — OXYCODONE-ACETAMINOPHEN 5-325 MG PO TABS
1.0000 | ORAL_TABLET | ORAL | 0 refills | Status: DC | PRN
Start: 2016-04-28 — End: 2016-06-24

## 2016-04-28 NOTE — ED Provider Notes (Signed)
[facility] Emergency Department Provider Note   ____________________________________________   First MD Initiated Contact with Patient 04/28/16 0145     (approximate)  I have reviewed the triage vital signs and the nursing notes.   HISTORY  Chief Complaint Abdominal Pain    HPI Toni Carter is a 25 y.o. female who presents to the ED from home with a chief complaint of right lower back pain and heavy period with clots. Reports onset. 2 days ago, heavy with clots. Today with right lower back pain and dysuria. Denies associated fever, shortness of breath, abdominal pain, nausea, vomiting, diarrhea. Denies recent travel or trauma. Nothing makes her symptoms better or worse.   Past Medical History:  Diagnosis Date  . Elevated liver enzymes    liver panel normal on 07-02-15  . Headache   . Hx MRSA infection   . Torn meniscus    left     Patient Active Problem List   Diagnosis Date Noted  . Abnormal uterine bleeding 07/04/2015  . Postpartum care following vaginal delivery 03/19/2015    Past Surgical History:  Procedure Laterality Date  . DILATATION & CURETTAGE/HYSTEROSCOPY WITH MYOSURE N/A 07/04/2015   Procedure: DILATATION & CURETTAGE/HYSTEROSCOPY WITH MYOSURE;  Surgeon: Gae Dry, MD;  Location: [facility] OR[facility];  Service: Gynecology;  Laterality: N/A;  . TUBAL LIGATION Bilateral 03/21/2015   Procedure: POST PARTUM TUBAL LIGATION;  Surgeon: Malachy Mood, MD;  Location: [facility];  Service: Gynecology;  Laterality: Bilateral;    Prior to Admission medications   Medication Sig Start Date End Date Taking? Authorizing Provider  cephALEXin (KEFLEX) 500 MG capsule Take 1 capsule (500 mg total) by mouth 4 (four) times daily. 04/28/16   Paulette Blanch, MD  ibuprofen (ADVIL,MOTRIN) 800 MG tablet Take 1 tablet (800 mg total) by mouth every 8 (eight) hours as needed for moderate pain. 04/28/16   Paulette Blanch, MD  Multiple Vitamin (MULTIVITAMIN) tablet Take 1  tablet by mouth daily.    Historical Provider, MD  ondansetron (ZOFRAN ODT) 4 MG disintegrating tablet Take 1 tablet (4 mg total) by mouth every 8 (eight) hours as needed for nausea or vomiting. 04/28/16   Paulette Blanch, MD  oxyCODONE-acetaminophen (ROXICET) 5-325 MG tablet Take 1 tablet by mouth every 4 (four) hours as needed for severe pain. 04/28/16   Paulette Blanch, MD    Allergies Morphine and related; Hydrocodone; Tylenol [acetaminophen]; Tape; and Vicodin [hydrocodone-acetaminophen]  Family history Nephrolithiasis  Social History Social History  Substance Use Topics  . Smoking status: Never Smoker  . Smokeless tobacco: Never Used  . Alcohol use Yes     Comment: RARE    Review of Systems  Constitutional: No fever/chills. Eyes: No visual changes. ENT: No sore throat. Cardiovascular: Denies chest pain. Respiratory: Denies shortness of breath. Gastrointestinal: No abdominal pain.  No nausea, no vomiting.  No diarrhea.  No constipation. Genitourinary: Positive for heavy period. Negative for dysuria. Musculoskeletal: Positive for back pain. Skin: Negative for rash. Neurological: Negative for headaches, focal weakness or numbness.  10-point ROS otherwise negative.  ____________________________________________   PHYSICAL EXAM:  VITAL SIGNS: ED Triage Vitals  Enc Vitals Group     BP 04/27/16 2306 116/77     Pulse Rate 04/27/16 2306 77     Resp 04/27/16 2306 18     Temp 04/27/16 2306 98.2 F (36.8 C)     Temp Source 04/27/16 2306 Oral     SpO2 04/27/16 2306 100 %  Weight 04/27/16 2305 240 lb (108.9 kg)     Height 04/27/16 2305 5\' 10"  (1.778 m)     Head Circumference --      Peak Flow --      Pain Score 04/27/16 2305 8     Pain Loc --      Pain Edu? --      Excl. in [place]? --     Constitutional: Alert and oriented. Well appearing and in mild acute distress. Eyes: Conjunctivae are normal. PERRL. EOMI. Head: Atraumatic. Nose: No congestion/rhinnorhea. Mouth/Throat:  Mucous membranes are moist.  Oropharynx non-erythematous. Neck: No stridor.   Cardiovascular: Normal rate, regular rhythm. Grossly normal heart sounds.  Good peripheral circulation. Respiratory: Normal respiratory effort.  No retractions. Lungs CTAB. Gastrointestinal: Soft and nontender to light or deep palpation. No distention. No abdominal bruits. Mild right CVA tenderness. Musculoskeletal: No lower extremity tenderness nor edema.  No joint effusions. Neurologic:  Normal speech and language. No gross focal neurologic deficits are appreciated. No gait instability. Skin:  Skin is warm, dry and intact. No rash noted. Psychiatric: Mood and affect are normal. Speech and behavior are normal.  ____________________________________________   LABS (all labs ordered are listed, but only abnormal results are displayed)  Labs Reviewed  BASIC METABOLIC PANEL - Abnormal; Notable for the following:       Result Value   Glucose, Bld 111 (*)    All other components within normal limits  URINALYSIS COMPLETEWITH MICROSCOPIC ([facility] ONLY) - Abnormal; Notable for the following:    Color, Urine YELLOW (*)    APPearance CLOUDY (*)    Hgb urine dipstick 3+ (*)    Protein, ur 100 (*)    Leukocytes, UA 1+ (*)    Squamous Epithelial / LPF 0-5 (*)    All other components within normal limits  URINE CULTURE  CBC  POC URINE PREG, ED  POCT PREGNANCY, URINE   ____________________________________________  EKG  None ____________________________________________  RADIOLOGY  CT renal colic study interpreted per Dr. Gerilyn Nestle: No renal or ureteral stone or obstruction. No acute process  demonstrated in the abdomen and pelvis on noncontrast imaging.   ____________________________________________   PROCEDURES  Procedure(s) performed: None  Procedures  Critical Care performed: No  ____________________________________________   INITIAL IMPRESSION / ASSESSMENT AND PLAN / ED COURSE  Pertinent labs &  imaging results that were available during my care of the patient were reviewed by me and considered in my medical decision making (see chart for details).  25 year old female who presents with menorrhagia; more concerned with right lower back pain. Laboratory urinalysis results remarkable for1+ leukocytes with 6-30 WBCs. Family history of nephrolithiasis. Will obtain CT renal colic study to evaluate for obstructing kidney stone.   Clinical Course as of Apr 28 433  Wed Apr 28, 2016  0403 Pain improved. Updated patient of negative CT results. Will discharge home with antibiotic prescription, pain and nausea medicine. Strict return precautions given. Patient verbalizes understanding and agrees with plan of care.  [JS]    Clinical Course User Index [JS] Paulette Blanch, MD     ____________________________________________   FINAL CLINICAL IMPRESSION(S) / ED DIAGNOSES  Final diagnoses:  Flank pain  Urinary tract infection without hematuria, site unspecified      NEW MEDICATIONS STARTED DURING THIS VISIT:  New Prescriptions   CEPHALEXIN (KEFLEX) 500 MG CAPSULE    Take 1 capsule (500 mg total) by mouth 4 (four) times daily.   IBUPROFEN (ADVIL,MOTRIN) 800 MG TABLET  Take 1 tablet (800 mg total) by mouth every 8 (eight) hours as needed for moderate pain.   ONDANSETRON (ZOFRAN ODT) 4 MG DISINTEGRATING TABLET    Take 1 tablet (4 mg total) by mouth every 8 (eight) hours as needed for nausea or vomiting.   OXYCODONE-ACETAMINOPHEN (ROXICET) 5-325 MG TABLET    Take 1 tablet by mouth every 4 (four) hours as needed for severe pain.     Note:  This document was prepared using Dragon voice recognition software and may include unintentional dictation errors.    Paulette Blanch, MD 04/28/16 [phone]

## 2016-04-28 NOTE — Discharge Instructions (Signed)
1. Take antibiotic as prescribed (Keflex 500 mg 3 times daily 7 days). 2. You may take pain and nausea medicines as needed (Motrin/Percocet/Zofran). 3. Drink plenty of fluids daily. 4. Return to the ER for worsening symptoms, persistent vomiting, difficulty breathing or other concerns.

## 2016-04-29 LAB — URINE CULTURE: SPECIAL REQUESTS: NORMAL

## 2016-06-24 ENCOUNTER — Emergency Department
Admission: EM | Admit: 2016-06-24 | Discharge: 2016-06-24 | Disposition: A | Discharge status: Home / Self Care | Payer: BLUE CROSS/BLUE SHIELD | Attending: Emergency Medicine | Admitting: Emergency Medicine

## 2016-06-24 DIAGNOSIS — J029 Acute pharyngitis, unspecified: Secondary | ICD-10-CM | POA: Insufficient documentation

## 2016-06-24 LAB — POCT PREGNANCY, URINE: Preg Test, Ur: NEGATIVE

## 2016-06-24 LAB — URINALYSIS, COMPLETE (UACMP) WITH MICROSCOPIC
BILIRUBIN URINE: NEGATIVE
Bacteria, UA: NONE SEEN
GLUCOSE, UA: NEGATIVE mg/dL
HGB URINE DIPSTICK: NEGATIVE
Ketones, ur: NEGATIVE mg/dL
LEUKOCYTES UA: NEGATIVE
NITRITE: NEGATIVE
PH: 5 (ref 5.0–8.0)
Protein, ur: NEGATIVE mg/dL
SPECIFIC GRAVITY, URINE: 1.017 (ref 1.005–1.030)

## 2016-06-24 LAB — POCT RAPID STREP A: Streptococcus, Group A Screen (Direct): NEGATIVE

## 2016-06-24 MED ORDER — FIRST-DUKES MOUTHWASH MT SUSP: 5.0000 mL | Freq: Three times a day (TID) | OROMUCOSAL | 0 refills | Status: DC

## 2016-06-24 MED ORDER — FIRST-DUKES MOUTHWASH MT SUSP: 5.0000 mL | Freq: Three times a day (TID) | OROMUCOSAL | 0 refills | Status: AC

## 2016-06-24 NOTE — Discharge Instructions (Signed)
Increase fluids and follow-up with Dr. Clemmie Krill if any continued problems. Duke's mouthwash as directed. Tylenol or ibuprofen as needed for throat pain. He may also take over-the-counter location for congestion as needed.

## 2016-06-24 NOTE — ED Provider Notes (Signed)
[facility] Emergency Department Provider Note  ____________________________________________   First MD Initiated Contact with Patient 06/24/16 1136     (approximate)  I have reviewed the triage vital signs and the nursing notes.   HISTORY  Chief Complaint Sore Throat   HPI Toni Carter is a 26 y.o. female is here complaining of sore throat for the last several days. Patient reports that she has had difficulty swallowing with some hoarseness. She denies any fever or chills. She states that there is increased pain with swallowing this morning. She has not taken any over-the-counter medication. Currently she rates her pain as 10 over 10. Patient states that she was seen at her primary care's office this week and told she had a urinary tract infection. She called today to get the results of the culture and her doctor's office is closed due to weather. She is not taking any antibiotics for her urinary tract infection.   Past Medical History:  Diagnosis Date  . Elevated liver enzymes    liver panel normal on 07-02-15  . Headache   . Hx MRSA infection   . Torn meniscus    left     Patient Active Problem List   Diagnosis Date Noted  . Abnormal uterine bleeding 07/04/2015  . Postpartum care following vaginal delivery 03/19/2015    Past Surgical History:  Procedure Laterality Date  . DILATATION & CURETTAGE/HYSTEROSCOPY WITH MYOSURE N/A 07/04/2015   Procedure: DILATATION & CURETTAGE/HYSTEROSCOPY WITH MYOSURE;  Surgeon: Gae Dry, MD;  Location: [facility] OR[facility];  Service: Gynecology;  Laterality: N/A;  . TUBAL LIGATION Bilateral 03/21/2015   Procedure: POST PARTUM TUBAL LIGATION;  Surgeon: Malachy Mood, MD;  Location: [facility] OR[facility];  Service: Gynecology;  Laterality: Bilateral;    Prior to Admission medications   Medication Sig Start Date End Date Taking? Authorizing Provider  Diphenhyd-Hydrocort-Nystatin (FIRST-DUKES MOUTHWASH) SUSP Use as directed 5 mLs  in the mouth or throat 4 (four) times daily -  before meals and at bedtime. 06/24/16   Johnn Hai, PA-C  Multiple Vitamin (MULTIVITAMIN) tablet Take 1 tablet by mouth daily.    Historical Provider, MD    Allergies Morphine and related; Hydrocodone; Tylenol [acetaminophen]; Tape; and Vicodin [hydrocodone-acetaminophen]  No family history on file.  Social History Social History  Substance Use Topics  . Smoking status: Never Smoker  . Smokeless tobacco: Never Used  . Alcohol use Yes     Comment: RARE    Review of Systems Constitutional: No fever/chills Eyes: No visual changes. ENT: Positive sore throat. Cardiovascular: Denies chest pain. Respiratory: Denies shortness of breath. Gastrointestinal: No nausea, no vomiting.   Musculoskeletal: Negative for muscle aches. Neurological: Negative for headaches, focal weakness or numbness.  10-point ROS otherwise negative.  ____________________________________________   PHYSICAL EXAM:  VITAL SIGNS: ED Triage Vitals [06/24/16 1052]  Enc Vitals Group     BP 132/79     Pulse Rate 82     Resp 18     Temp 98.4 F (36.9 C)     Temp Source Oral     SpO2 97 %     Weight 220 lb (99.8 kg)     Height 5\' 10"  (1.778 m)     Head Circumference      Peak Flow      Pain Score 10     Pain Loc      Pain Edu?      Excl. in [place]?     Constitutional: Alert and oriented.  Well appearing and in no acute distress. Eyes: Conjunctivae are normal. PERRL. EOMI. Head: Atraumatic. Nose: Mild congestion/rhinnorhea. Mouth/Throat: Mucous membranes are moist.  Oropharynx non-erythematous. Positive posterior drainage. Neck: No stridor.   Hematological/Lymphatic/Immunilogical: No cervical lymphadenopathy. Cardiovascular: Normal rate, regular rhythm. Grossly normal heart sounds.  Good peripheral circulation. Respiratory: Normal respiratory effort.  No retractions. Lungs CTAB. Gastrointestinal: Soft and nontender. No distention.  Musculoskeletal: Moves  upper and lower extremities without any difficulty. Normal gait was noted. Neurologic:  Normal speech and language. No gross focal neurologic deficits are appreciated. No gait instability. Skin:  Skin is warm, dry and intact. No rash noted. Psychiatric: Mood and affect are normal. Speech and behavior are normal.  ____________________________________________   LABS (all labs ordered are listed, but only abnormal results are displayed)  Labs Reviewed  URINALYSIS, COMPLETE (UACMP) WITH MICROSCOPIC - Abnormal; Notable for the following:       Result Value   Color, Urine YELLOW (*)    APPearance CLEAR (*)    Squamous Epithelial / LPF 0-5 (*)    All other components within normal limits  POCT RAPID STREP A  POC URINE PREG, ED  POCT PREGNANCY, URINE      PROCEDURES  Procedure(s) performed: None  Procedures  Critical Care performed: No  ____________________________________________   INITIAL IMPRESSION / ASSESSMENT AND PLAN / ED COURSE  Pertinent labs & imaging results that were available during my care of the patient were reviewed by me and considered in my medical decision making (see chart for details).    Clinical Course    Patient was given a prescription for Duke's mouthwash for her sore throat. She is also encouraged take over-the-counter decongestants for posterior drainage. Her urinalysis today in the emergency room was negative. She is encouraged call her primary care doctor for results from her culture since there is nothing from her urinalysis to treat.  ____________________________________________   FINAL CLINICAL IMPRESSION(S) / ED DIAGNOSES  Final diagnoses:  Viral pharyngitis      NEW MEDICATIONS STARTED DURING THIS VISIT:  Discharge Medication List as of 06/24/2016  1:24 PM       Note:  This document was prepared using Dragon voice recognition software and may include unintentional dictation errors.    Johnn Hai, PA-C 06/24/16 [place], MD 06/24/16 [phone]

## 2016-06-24 NOTE — ED Notes (Signed)
See triage note   States she developed a sore throat several days ago  Having increased pain with swallowing this am  Afebrile on arrival

## 2016-06-24 NOTE — ED Notes (Signed)
Swab for strep done.

## 2016-06-24 NOTE — ED Triage Notes (Signed)
Pt reports sore throat for past few days. Pt reports difficulty swallowing and hoarseness. Denies fever.

## 2016-09-04 IMAGING — CT CT HEAD W/O CM
1 series · 16 of 30 positions shown, 20 images · non-contrast
Comparison: CT of the head performed 12/09/2005

CLINICAL DATA: Acute onset of low grade fever and altered mental
status. Initial encounter.

EXAM:
CT HEAD WITHOUT CONTRAST
TECHNIQUE: Contiguous axial images were obtained from the base of the skull
through the vertex without intravenous contrast.

[Series 2: head wo · axial · 0.40mm/px · z∈[-139,-8]mm · 16 of 30 slices shown, 20 images]
[im 2/30  brain]
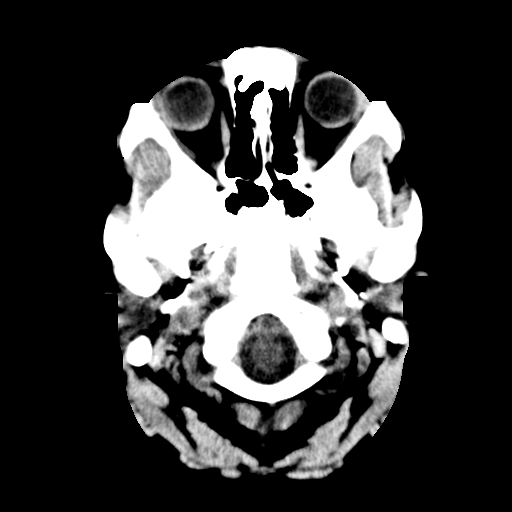
[im 2/30  bone]
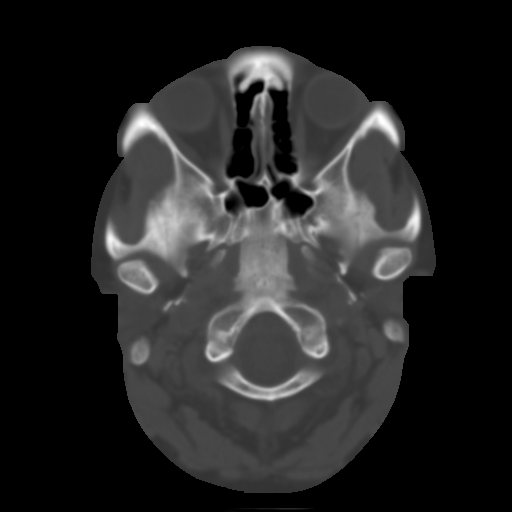
[im 4/30  brain]
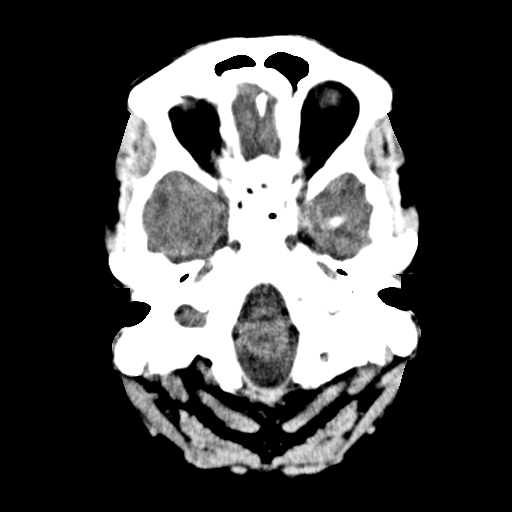
[im 6/30  brain]
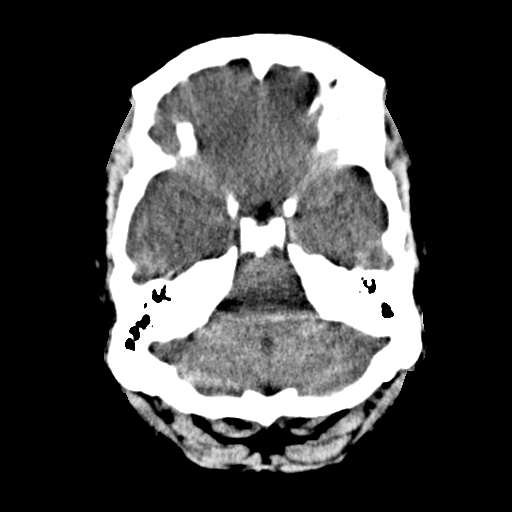
[im 8/30  brain]
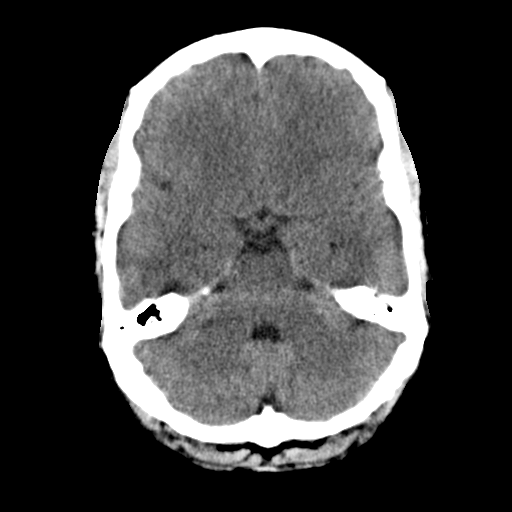
[im 9/30  brain]
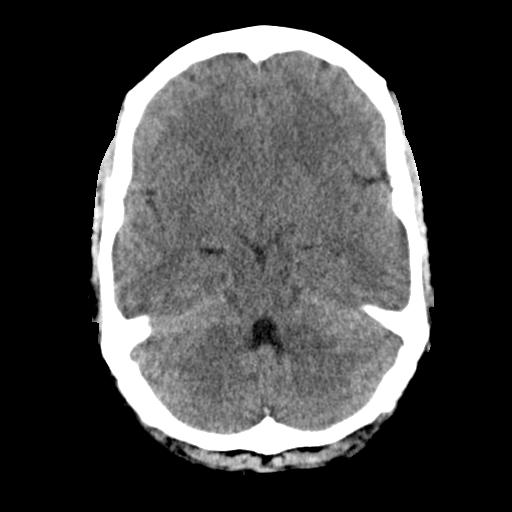
[im 9/30  bone]
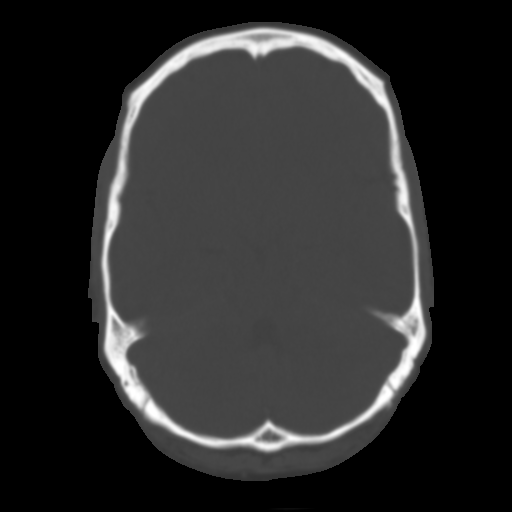
[im 11/30  brain]
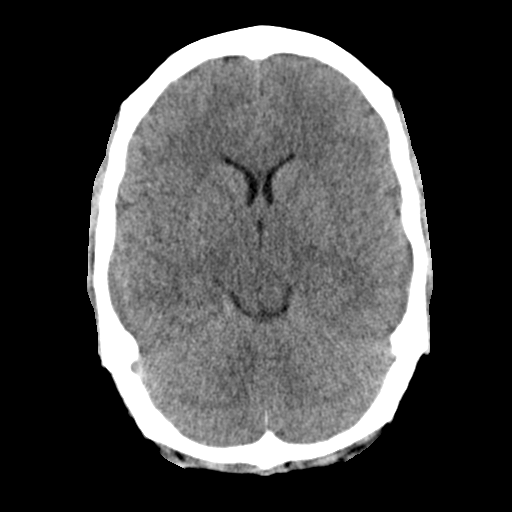
[im 13/30  brain]
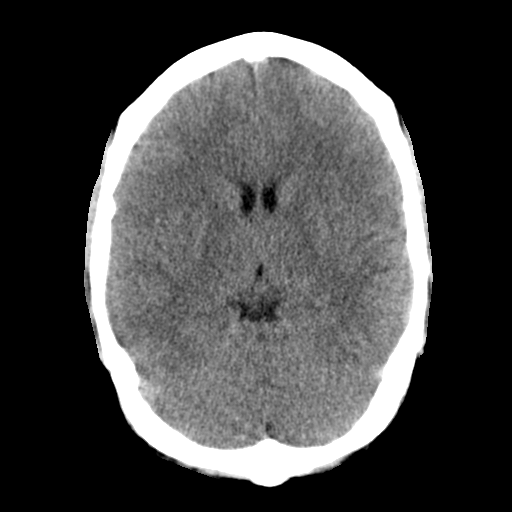
[im 15/30  brain]
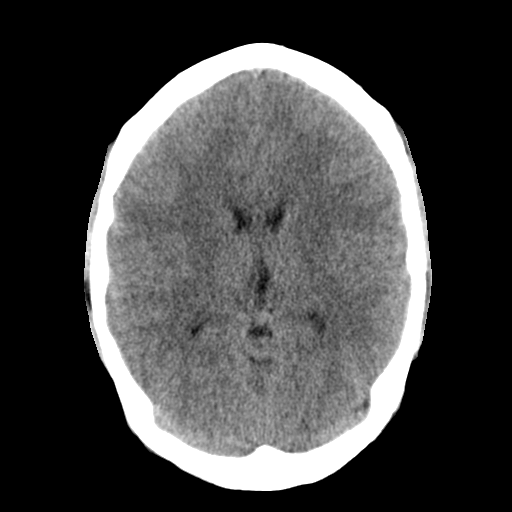
[im 16/30  brain]
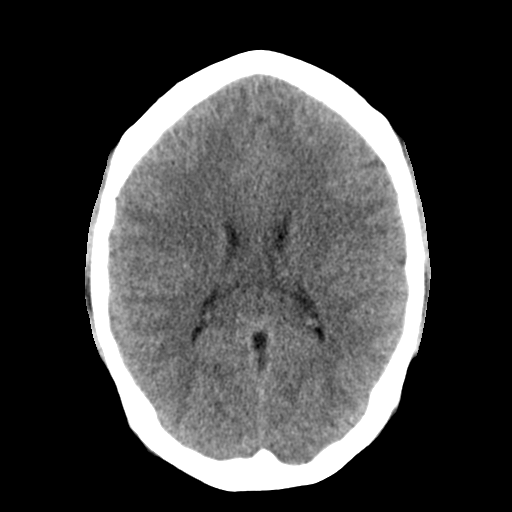
[im 16/30  bone]
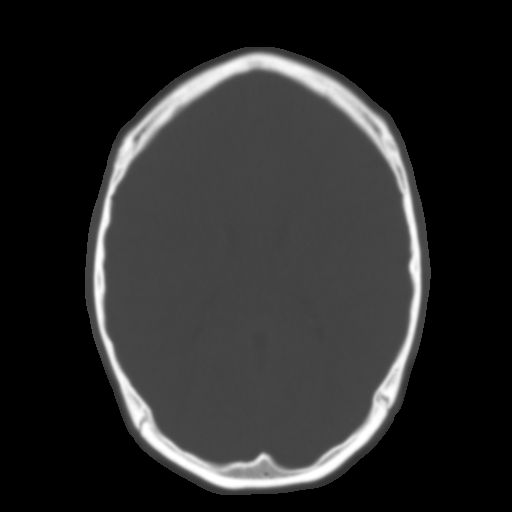
[im 18/30  brain]
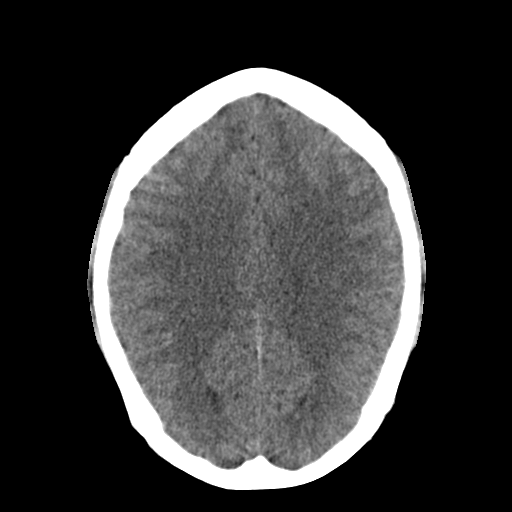
[im 20/30  brain]
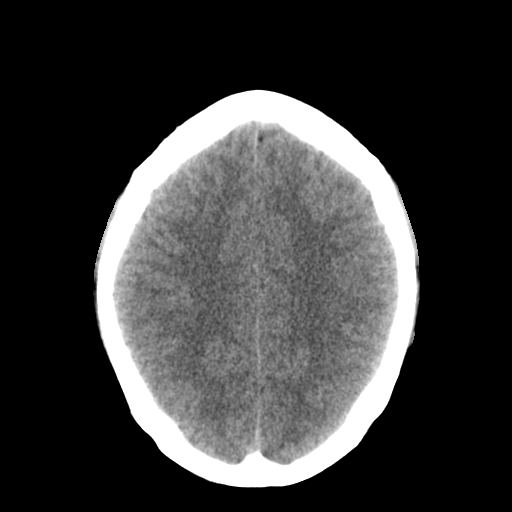
[im 22/30  brain]
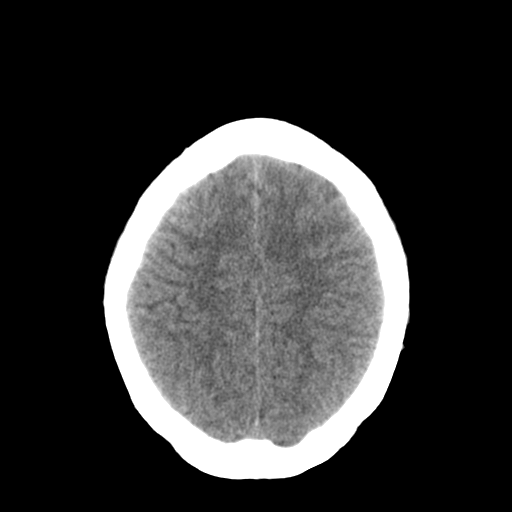
[im 23/30  brain]
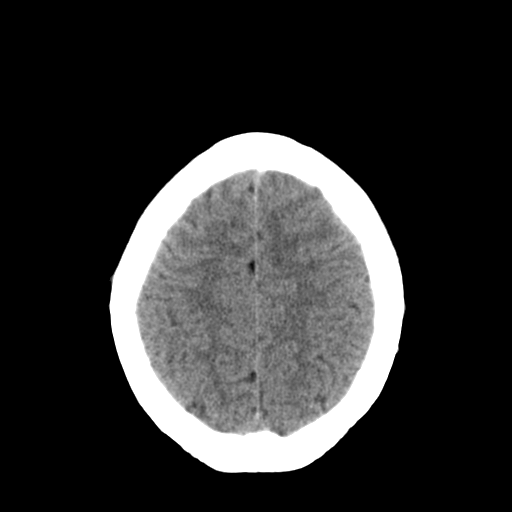
[im 23/30  bone]
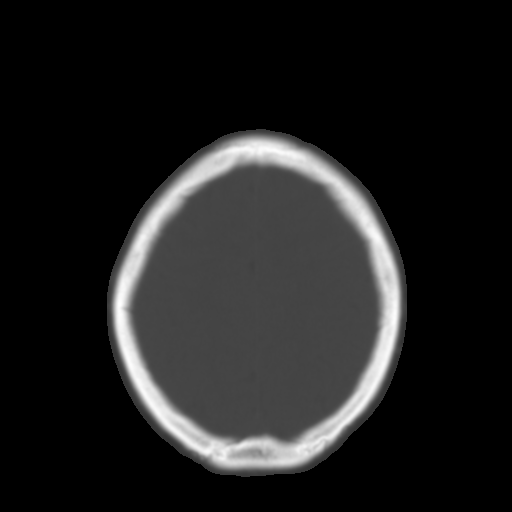
[im 25/30  brain]
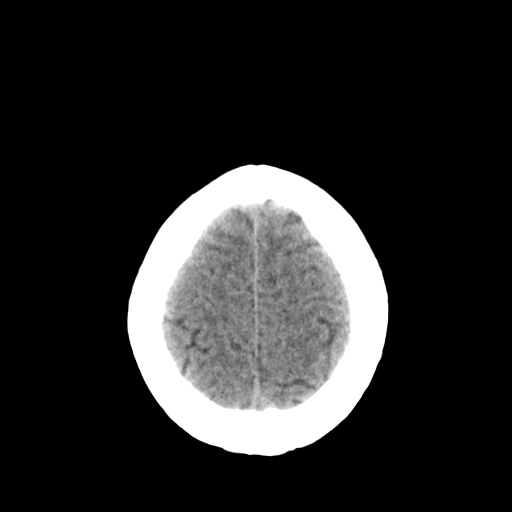
[im 27/30  brain]
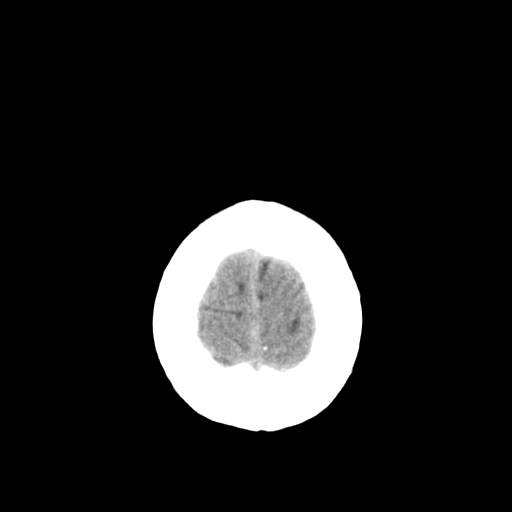
[im 29/30  brain]
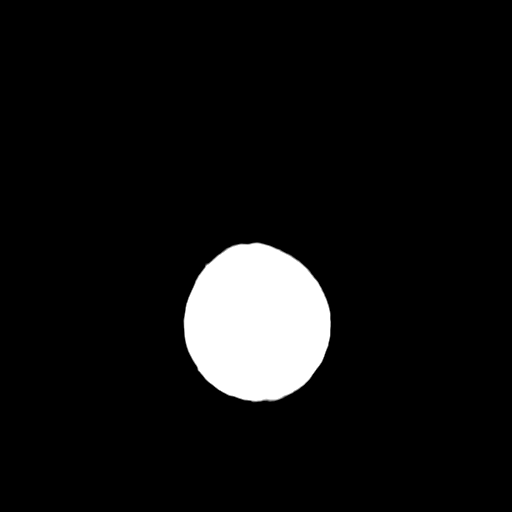

[16 of 30 positions shown; findings below may reference images not displayed]

FINDINGS: There is no evidence of acute infarction, mass lesion, or intra- or
extra-axial hemorrhage on CT.

The posterior fossa, including the cerebellum, brainstem and fourth
ventricle, is within normal limits. The third and lateral
ventricles, and basal ganglia are unremarkable in appearance. The
cerebral hemispheres are symmetric in appearance, with normal
gray-white differentiation. No mass effect or midline shift is seen.

There is no evidence of fracture; visualized osseous structures are
unremarkable in appearance. The visualized portions of the orbits
are within normal limits. The paranasal sinuses and mastoid air
cells are well-aerated. No significant soft tissue abnormalities are
seen.
IMPRESSION: Unremarkable noncontrast CT of the head.

## 2016-10-19 HISTORY — PX: FINGER TENDON REPAIR: SHX1640

## 2017-04-16 ENCOUNTER — Emergency Department
Admission: EM | Admit: 2017-04-16 | Discharge: 2017-04-16 | Disposition: A | Discharge status: Home / Self Care | Payer: BLUE CROSS/BLUE SHIELD | Attending: Emergency Medicine | Admitting: Emergency Medicine

## 2017-04-16 ENCOUNTER — Encounter: Payer: Self-pay | Admitting: Emergency Medicine

## 2017-04-16 ENCOUNTER — Emergency Department: Payer: BLUE CROSS/BLUE SHIELD

## 2017-04-16 DIAGNOSIS — R102 Pelvic and perineal pain: Secondary | ICD-10-CM | POA: Insufficient documentation

## 2017-04-16 DIAGNOSIS — Z79899 Other long term (current) drug therapy: Secondary | ICD-10-CM | POA: Insufficient documentation

## 2017-04-16 DIAGNOSIS — N939 Abnormal uterine and vaginal bleeding, unspecified: Secondary | ICD-10-CM

## 2017-04-16 DIAGNOSIS — N926 Irregular menstruation, unspecified: Secondary | ICD-10-CM

## 2017-04-16 DIAGNOSIS — R1030 Lower abdominal pain, unspecified: Secondary | ICD-10-CM

## 2017-04-16 LAB — URINALYSIS, COMPLETE (UACMP) WITH MICROSCOPIC
BACTERIA UA: NONE SEEN
BILIRUBIN URINE: NEGATIVE
Glucose, UA: NEGATIVE mg/dL
Hgb urine dipstick: NEGATIVE
KETONES UR: NEGATIVE mg/dL
Leukocytes, UA: NEGATIVE
Nitrite: NEGATIVE
PH: 6 (ref 5.0–8.0)
Protein, ur: NEGATIVE mg/dL
RBC / HPF: NONE SEEN RBC/hpf (ref 0–5)
SPECIFIC GRAVITY, URINE: 1.006 (ref 1.005–1.030)
SQUAMOUS EPITHELIAL / LPF: NONE SEEN
WBC UA: NONE SEEN WBC/hpf (ref 0–5)

## 2017-04-16 LAB — POCT PREGNANCY, URINE: Preg Test, Ur: NEGATIVE

## 2017-04-16 MED ORDER — OXYCODONE-ACETAMINOPHEN 5-325 MG PO TABS: 1.0000 | ORAL_TABLET | Freq: Four times a day (QID) | ORAL | 0 refills | Status: AC | PRN

## 2017-04-16 MED ORDER — OXYCODONE-ACETAMINOPHEN 5-325 MG PO TABS
1.0000 | ORAL_TABLET | Freq: Once | ORAL | Status: AC
  Administered 2017-04-16: 1 via ORAL
  Filled 2017-04-16: qty 1

## 2017-04-16 MED ORDER — IBUPROFEN 800 MG PO TABS: 800.0000 mg | ORAL_TABLET | Freq: Three times a day (TID) | ORAL | 0 refills | Status: DC | PRN

## 2017-04-16 NOTE — ED Triage Notes (Signed)
Pt c/o severe pelvic pain.  Missed period in September and started spotting some this month but has not had normal period this month. Tubes are tied.  Denies NVD. States "pain is not in my abdomen it is in my pelvis.  Has had large amt clear vaginal discharge.  No hx STI.

## 2017-04-16 NOTE — Discharge Instructions (Signed)
Ultrasound exam results were normal. I recommend following up with gynecology.   Follow up with Gyn as soon as you are able to schedule an appointment for continued care.

## 2017-04-16 NOTE — ED Provider Notes (Signed)
[facility] Emergency Department Provider Note   ____________________________________________   I have reviewed the triage vital signs and the nursing notes.   HISTORY  Chief Complaint Pelvic Pain    HPI Toni Carter is a 26 y.o. female since to the emergency department with lower abdominal pain that developed several days ago.  Patient reports in September she experienced a missed menstrual cycle with intermittent spotting prior to that she had regular periods.  Patient reports having a tubal ligation following her childbirth in 2016 since that procedure regular periods.  Patient reports since missing her period in September she began to experience the spotting and now irregular menstrual flow.  Today she noted onset of left lower abdominal pain.  Patient contacted her gynecologist who advised her to come to the emergency department for evaluation.  Patient also noted prior to menstrual discharge clear vaginal discharge.  Patient denies any past history of sexually transmitted infections.  She reports only one sexual partner and is monogamous.  She does not feel she has contracted an STI and does not feel she is at risk for an STI.   Patient denies fever, chills, headache, vision changes, chest pain, chest tightness, shortness of breath, abdominal pain, nausea and vomiting.  Past Medical History:  Diagnosis Date  . Elevated liver enzymes    liver panel normal on 07-02-15  . Headache   . Hx MRSA infection   . Torn meniscus    left     Patient Active Problem List   Diagnosis Date Noted  . Abnormal uterine bleeding 07/04/2015  . Postpartum care following vaginal delivery 03/19/2015    Past Surgical History:  Procedure Laterality Date  . DILATATION & CURETTAGE/HYSTEROSCOPY WITH MYOSURE N/A 07/04/2015   Procedure: DILATATION & CURETTAGE/HYSTEROSCOPY WITH MYOSURE;  Surgeon: Gae Dry, MD;  Location: [facility] ORS;  Service: Gynecology;  Laterality: N/A;   . TUBAL LIGATION Bilateral 03/21/2015   Procedure: POST PARTUM TUBAL LIGATION;  Surgeon: Malachy Mood, MD;  Location: [facility] ORS;  Service: Gynecology;  Laterality: Bilateral;    Prior to Admission medications   Medication Sig Start Date End Date Taking? Authorizing Provider  Diphenhyd-Hydrocort-Nystatin (FIRST-DUKES MOUTHWASH) SUSP Use as directed 5 mLs in the mouth or throat 4 (four) times daily -  before meals and at bedtime. 06/24/16   Johnn Hai, PA-C  ibuprofen (ADVIL,MOTRIN) 800 MG tablet Take 1 tablet (800 mg total) by mouth every 8 (eight) hours as needed. 04/16/17   Toshi Ishii M, PA-C  Multiple Vitamin (MULTIVITAMIN) tablet Take 1 tablet by mouth daily.    [provider]  oxyCODONE-acetaminophen (ROXICET) 5-325 MG tablet Take 1 tablet by mouth every 6 (six) hours as needed. 04/16/17 04/16/18  Dione Mccombie M, PA-C    Allergies Morphine and related; Hydrocodone; Tylenol [acetaminophen]; Tape; and Vicodin [hydrocodone-acetaminophen]  History reviewed. No pertinent family history.  Social History Social History  Substance Use Topics  . Smoking status: Never Smoker  . Smokeless tobacco: Never Used  . Alcohol use Yes     Comment: RARE    Review of Systems Constitutional: Negative for fever/chills Eyes: No visual changes. ENT:  Negative for sore throat and for difficulty swallowing Cardiovascular: Denies chest pain. Respiratory: Denies cough. Denies shortness of breath. Gastrointestinal: Left lower abdominal pain.  No nausea, vomiting, diarrhea. Genitourinary: Negative for dysuria.  Musculoskeletal: Negative for back pain. Skin: Negative for rash. Neurological: Negative for headaches. Able to ambulate. ____________________________________________   PHYSICAL EXAM:  VITAL SIGNS: ED  Triage Vitals [04/16/17 1433]  Enc Vitals Group     BP 119/87     Pulse Rate 64     Resp 16     Temp 98.3 F (36.8 C)     Temp Source Oral     SpO2 99 %     Weight  230 lb (104.3 kg)     Height 5\' 10"  (1.778 m)     Head Circumference      Peak Flow      Pain Score 7     Pain Loc      Pain Edu?      Excl. in [place]?     Constitutional: Alert and oriented. Well appearing and in no acute distress.  Eyes: Conjunctivae are normal. PERRL. EOMI  Head: Normocephalic and atraumatic. Neck:Supple. No thyromegaly. No stridor.  Cardiovascular: Normal rate, regular rhythm. Normal S1 and S2.  Good peripheral circulation. Respiratory: Normal respiratory effort without tachypnea or retractions. Lungs CTAB. No wheezes/rales/rhonchi. Good air entry to the bases with no decreased or absent breath sounds. Hematological/Lymphatic/Immunological: No cervical lymphadenopathy. Gastrointestinal: Bowel sounds 4 quadrants. Soft and nontender to palpation. No guarding or rigidity. No palpable masses. No distention. No CVA tenderness. Genitourinary: Negative for dysuria.  Negative with discharge during exam.  Musculoskeletal: Nontender with normal range of motion in all extremities. Neurologic: Normal speech and language.  Skin:  Skin is warm, dry and intact. No rash noted. Psychiatric: Mood and affect are normal. Speech and behavior are normal. Patient exhibits appropriate insight and judgement.  ____________________________________________   LABS (all labs ordered are listed, but only abnormal results are displayed)  Labs Reviewed  URINALYSIS, COMPLETE (UACMP) WITH MICROSCOPIC - Abnormal; Notable for the following:       Result Value   Color, Urine STRAW (*)    APPearance CLEAR (*)    All other components within normal limits  POC URINE PREG, ED  POCT PREGNANCY, URINE   ____________________________________________  EKG Done ____________________________________________  RADIOLOGY Ultrasound pelvic complete with transvaginal IMPRESSION: Negative for ovarian torsion. Negative pelvic  ultrasound. ____________________________________________   PROCEDURES  Procedure(s) performed: no    Critical Care performed: no ____________________________________________   INITIAL IMPRESSION / ASSESSMENT AND PLAN / ED COURSE  Pertinent labs & imaging results that were available during my care of the patient were reviewed by me and considered in my medical decision making (see chart for details).   Patient presents to emergency department with lower pelvic pain and irregular vaginal bleeding over the last month.  History, physical exam findings, labs and imaging are consistent with irregular menstrual bleeding.  Ultrasound imaging was unremarkable for ovarian torsion and otherwise negative pelvic ultrasound.  Labs are unremarkable for urinary infection and negative urine pregnancy. Patient will be prescribed short course of Percocet with transition to ibuprofen for pain management.  Patient advised to follow up with gynecology for continued care or return to the emergency department if symptoms return or worsen. Patient informed of clinical course, understand medical decision-making process, and agree with plan. ____________________________________________   FINAL CLINICAL IMPRESSION(S) / ED DIAGNOSES  Final diagnoses:  Vaginal bleeding, abnormal  Pelvic pain  Lower abdominal pain  Irregular menstrual bleeding       NEW MEDICATIONS STARTED DURING THIS VISIT:  New Prescriptions   IBUPROFEN (ADVIL,MOTRIN) 800 MG TABLET    Take 1 tablet (800 mg total) by mouth every 8 (eight) hours as needed.   OXYCODONE-ACETAMINOPHEN (ROXICET) 5-325 MG TABLET    Take 1 tablet by mouth  every 6 (six) hours as needed.     Note:  This document was prepared using Dragon voice recognition software and may include unintentional dictation errors.    Jerolyn Shin, PA-C 04/16/17 Kennieth Francois, MD 04/16/17 [phone]

## 2017-04-22 ENCOUNTER — Encounter: Payer: Self-pay | Admitting: Emergency Medicine

## 2017-04-22 ENCOUNTER — Emergency Department: Payer: BLUE CROSS/BLUE SHIELD

## 2017-04-22 ENCOUNTER — Emergency Department
Admission: EM | Admit: 2017-04-22 | Discharge: 2017-04-22 | Disposition: A | Discharge status: Home / Self Care | Payer: BLUE CROSS/BLUE SHIELD | Attending: Emergency Medicine | Admitting: Emergency Medicine

## 2017-04-22 DIAGNOSIS — Z5321 Procedure and treatment not carried out due to patient leaving prior to being seen by health care provider: Secondary | ICD-10-CM | POA: Insufficient documentation

## 2017-04-22 DIAGNOSIS — R079 Chest pain, unspecified: Secondary | ICD-10-CM | POA: Insufficient documentation

## 2017-04-22 LAB — BASIC METABOLIC PANEL
Anion gap: 5 (ref 5–15)
BUN: 12 mg/dL (ref 6–20)
CALCIUM: 9.4 mg/dL (ref 8.9–10.3)
CO2: 25 mmol/L (ref 22–32)
CREATININE: 0.48 mg/dL (ref 0.44–1.00)
Chloride: 108 mmol/L (ref 101–111)
GFR calc Af Amer: >60 mL/min (ref >60)
GFR calc non Af Amer: >60 mL/min (ref >60)
GLUCOSE: 94 mg/dL (ref 65–99)
Potassium: 3.7 mmol/L (ref 3.5–5.1)
Sodium: 138 mmol/L (ref 135–145)

## 2017-04-22 LAB — CBC
HCT: 37.9 % (ref 35.0–47.0)
HEMOGLOBIN: 13 g/dL (ref 12.0–16.0)
MCH: 28.7 pg (ref 26.0–34.0)
MCHC: 34.4 g/dL (ref 32.0–36.0)
MCV: 83.4 fL (ref 80.0–100.0)
PLATELETS: 161 10*3/uL (ref 150–440)
RBC: 4.55 MIL/uL (ref 3.80–5.20)
RDW: 14 % (ref 11.5–14.5)
WBC: 7.1 10*3/uL (ref 3.6–11.0)

## 2017-04-22 LAB — TROPONIN I

## 2017-04-22 NOTE — ED Notes (Signed)
FIRST NURSE NOTE-here for chest pain/SHOB. ambulatory without distress. Pulled for triage as soon as room open.

## 2017-04-22 NOTE — ED Triage Notes (Signed)
Pt states that she has been having chest pain and shortness of breath for the last several hours. Pt reports that she has also had episodes of sweating and dizziness. Pt denies N/V. Pt denies anything that makes the pain worse. Pt is in NAD at this time.

## 2017-04-22 NOTE — ED Notes (Signed)
Pt ambulatory to check in to check on wait. No distress.

## 2017-04-25 ENCOUNTER — Telehealth: Payer: Self-pay | Admitting: Emergency Medicine

## 2017-04-25 NOTE — Telephone Encounter (Signed)
Called patient due to lwot to inquire about condition and follow up plans. She says she is doing better today.  I asked her if she has called pcp, and she has not.  Says that her doctor had sent her here.  I asked her to call her doctor and let them know there are lab results in her chart.  She agrees.

## 2017-06-29 ENCOUNTER — Other Ambulatory Visit: Payer: Self-pay

## 2017-06-29 ENCOUNTER — Ambulatory Visit (INDEPENDENT_AMBULATORY_CARE_PROVIDER_SITE_OTHER): Payer: Self-pay | Admitting: Obstetrics & Gynecology

## 2017-06-29 ENCOUNTER — Encounter: Payer: Self-pay | Admitting: Obstetrics & Gynecology

## 2017-06-29 VITALS — BP 118/70 | HR 75 | Ht 70.0 in | Wt 248.0 lb

## 2017-06-29 DIAGNOSIS — N946 Dysmenorrhea, unspecified: Secondary | ICD-10-CM

## 2017-06-29 DIAGNOSIS — N939 Abnormal uterine and vaginal bleeding, unspecified: Secondary | ICD-10-CM

## 2017-06-29 DIAGNOSIS — N920 Excessive and frequent menstruation with regular cycle: Secondary | ICD-10-CM

## 2017-06-29 NOTE — Progress Notes (Signed)
HPI:      [name] is a 27 y.o. G2P1002 who LMP was Patient's last menstrual period was 06/26/2017., presents today for a problem visit.  She complains of menorrhagia that  began since delivery 2 years ago and its severity is described as severe.  She has regular periods every 28 days and they are associated with severe menstrual cramping.  She has used the following for attempts at control: tampon and pad.  Previous evaluation: 2 years ago; after delivery w concern for retained placenta but D&C negative for that.   PMHx: She  has a past medical history of Elevated liver enzymes, Headache, MRSA infection, and Torn meniscus. Also,  has a past surgical history that includes Tubal ligation (Bilateral, 03/21/2015); Dilatation & curettage/hysteroscopy with myosure (N/A, 07/04/2015); and Finger tendon repair (Left, 10/2016)., family history includes Cancer in her father; Lung cancer in her father.,  reports that  has never smoked. she has never used smokeless tobacco. She reports that she drinks alcohol. She reports that she does not use drugs.  She  Current Outpatient Medications:  .  Diphenhyd-Hydrocort-Nystatin (FIRST-DUKES MOUTHWASH) SUSP, Use as directed 5 mLs in the mouth or throat 4 (four) times daily -  before meals and at bedtime. (Patient not taking: Reported on 06/29/2017), Disp: 90 mL, Rfl: 0 .  ibuprofen (ADVIL,MOTRIN) 800 MG tablet, Take 1 tablet (800 mg total) by mouth every 8 (eight) hours as needed. (Patient not taking: Reported on 06/29/2017), Disp: 30 tablet, Rfl: 0 .  Multiple Vitamin (MULTIVITAMIN) tablet, Take 1 tablet by mouth daily., Disp: , Rfl:  .  oxyCODONE-acetaminophen (ROXICET) 5-325 MG tablet, Take 1 tablet by mouth every 6 (six) hours as needed. (Patient not taking: Reported on 06/29/2017), Disp: 8 tablet, Rfl: 0  Also, is allergic to morphine and related; hydrocodone; tylenol [acetaminophen]; tape; and vicodin [hydrocodone-acetaminophen].  Review of Systems    Constitutional: Negative for chills, fever and malaise/fatigue.  HENT: Negative for congestion, sinus pain and sore throat.   Eyes: Negative for blurred vision and pain.  Respiratory: Negative for cough and wheezing.   Cardiovascular: Negative for chest pain and leg swelling.  Gastrointestinal: Negative for abdominal pain, constipation, diarrhea, heartburn, nausea and vomiting.  Genitourinary: Negative for dysuria, frequency, hematuria and urgency.  Musculoskeletal: Negative for back pain, joint pain, myalgias and neck pain.  Skin: Negative for itching and rash.  Neurological: Negative for dizziness, tremors and weakness.  Endo/Heme/Allergies: Does not bruise/bleed easily.  Psychiatric/Behavioral: Negative for depression. The patient is not nervous/anxious and does not have insomnia.     Objective: BP 118/70 (BP Location: Right Arm, Patient Position: Sitting, Cuff Size: Normal)   Pulse 75   Ht 5\' 10"  (1.778 m)   Wt 248 lb (112.5 kg)   LMP 06/26/2017   BMI 35.58 kg/m  Physical Exam  Constitutional: She is oriented to person, place, and time. She appears well-developed and well-nourished. No distress.  Genitourinary: Rectum normal, vagina normal and uterus normal. Pelvic exam was performed with patient supine. There is no rash or lesion on the right labia. There is no rash or lesion on the left labia. Vagina exhibits no lesion. No bleeding in the vagina. Right adnexum does not display mass and does not display tenderness. Left adnexum does not display mass and does not display tenderness. Cervix does not exhibit motion tenderness, lesion, friability or polyp.   Uterus is mobile and midaxial. Uterus is not enlarged or exhibiting a mass.  HENT:  Head: Normocephalic and  atraumatic. Head is without laceration.  Right Ear: Hearing normal.  Left Ear: Hearing normal.  Nose: No epistaxis.  No foreign bodies.  Mouth/Throat: Uvula is midline, oropharynx is clear and moist and mucous membranes  are normal.  Eyes: Pupils are equal, round, and reactive to light.  Neck: Normal range of motion. Neck supple. No thyromegaly present.  Cardiovascular: Normal rate and regular rhythm. Exam reveals no gallop and no friction rub.  No murmur heard. Pulmonary/Chest: Effort normal and breath sounds normal. No respiratory distress. She has no wheezes. Right breast exhibits no mass, no skin change and no tenderness. Left breast exhibits no mass, no skin change and no tenderness.  Abdominal: Soft. Bowel sounds are normal. She exhibits no distension. There is no tenderness. There is no rebound.  Musculoskeletal: Normal range of motion.  Neurological: She is alert and oriented to person, place, and time. No cranial nerve deficit.  Skin: Skin is warm and dry.  Psychiatric: She has a normal mood and affect. Judgment normal.  Vitals reviewed.   ASSESSMENT/PLAN:  menorrhagia and Dysmenorrhea  Problem List Items Addressed This Visit      Genitourinary   Abnormal uterine bleeding - Primary   Relevant Orders   US PELVIS TRANSVANGINAL NON-OB (TV ONLY)   Dysmenorrhea   Relevant Orders   US PELVIS TRANSVANGINAL NON-OB (TV ONLY)     Other   Menorrhagia with regular cycle   Relevant Orders   US PELVIS TRANSVANGINAL NON-OB (TV ONLY)    Patient has abnormal uterine bleeding . She has a normal exam today, with no evidence of lesions.  Evaluation includes the following: exam, labs such as hormonal testing, and pelvic ultrasound to evaluate for any structural gynecologic abnormalities.  Patient to follow up after testing.  Treatment option for menorrhagia or menometrorrhagia discussed in great detail with the patient.  Options include hormonal therapy, IUD therapy such as Mirena, D&C, Ablation, and Hysterectomy.  The pros and cons of each option discussed with patient.  Recent lab work negative for anemia at PCP  Barnett Applebaum, MD, [facility], [facility] 06/29/2017  11:18 AM

## 2017-06-29 NOTE — Patient Instructions (Signed)
Transvaginal Ultrasound A transvaginal ultrasound, also called an endovaginal ultrasound, is a test that uses harmless sound waves to take pictures of the female genital tract. The pictures are taken with a device, called a transducer, that is placed in the vagina. This test may be done to:  Check for problems with your pregnancy.  Examine your developing baby.  Check for anything abnormal in the uterus or ovaries.  Evaluate pelvic pain or bleeding.  Tell a health care provider about:  Any allergies you have.  All medicines you are taking, including vitamins, herbs, eye drops, creams, and over-the-counter medicines.  Any blood disorders you have.  Any surgeries you have had.  Any medical conditions you have.  Whether you are pregnant or may be pregnant.  Whether you are having your period. What are the risks? There are no known risks or complications of having this test. What happens before the procedure?  This test needs to be done when your bladder is empty. Follow your health care provider's instructions about drinking fluids and emptying your bladder before the test. What happens during the procedure?  You will empty your bladder.  You will undress from the waist down.  You will lie down on an examining table, with your knees bent and your feet in foot holders.  A health care provider will cover the transducer with a sterile condom.  A gel will be put on the transducer. The gel helps transmit the sound waves and prevents irritation to your vagina.  The technician will insert the transducer into your vagina to get images. These will be displayed on a monitor that looks like a small television screen.  The transducer will be removed when the procedure is complete. What happens after the procedure?  It is your responsibility to get your test results. Ask your health care provider or the department performing the test when your results will be ready.  Keep follow-up  visits as told by your health care provider. This is important. This information is not intended to replace advice given to you by your health care provider. Make sure you discuss any questions you have with your health care provider. Document Released: 05/19/2004 Document Revised: 02/08/2016 Document Reviewed: 11/06/2014 Elsevier Interactive Patient Education  2018 [employer]vier Inc.  

## 2017-07-13 ENCOUNTER — Other Ambulatory Visit: Payer: Self-pay

## 2017-07-13 ENCOUNTER — Ambulatory Visit: Payer: Self-pay | Admitting: Obstetrics & Gynecology

## 2017-09-01 ENCOUNTER — Telehealth: Payer: Self-pay

## 2017-09-01 NOTE — Telephone Encounter (Signed)
No [place] here needed;;, have Tonya assist w medical release to get [place] report (where did she have it done?)

## 2017-09-01 NOTE — Telephone Encounter (Signed)
Schedule GYN [place] and follow up appt w PH to discuss and consider hormonal/other options I do not have ER records to review (must have not been at [facility] ER)

## 2017-09-01 NOTE — Telephone Encounter (Signed)
Patient is requesting to not have an ultrasound at this time due to having to pay for one when she just had one. Pt is going to request her images on an disc to bring to Dr. Kenton Kingfisher to review and is requesting to schedule an follow up to be advise as far as treat. Please advise if patient needs to have ultrasound in our office or if she can bring images for [place] to review

## 2017-09-01 NOTE — Telephone Encounter (Signed)
Patient was seen at [facility] hospital advise patient to come in a sign medical release forms for [place] to have those records

## 2017-09-01 NOTE — Telephone Encounter (Signed)
Pt called wanting PH to call her.  [phone]  Pt states this cycle is very painful and is passing clots the size of half dollars.  Was seen in ED, was told her uterus looked fine, to use ice pack or heating pad.  I also adv she could take ibuprofen 600mg  q6h or 800mg  q8h.  Will send msg to [facility].  Pt can be reached at this same number.

## 2017-09-02 NOTE — Telephone Encounter (Signed)
Did you get in touch with this pt?

## 2018-09-25 ENCOUNTER — Telehealth: Payer: Self-pay | Admitting: Obstetrics and Gynecology

## 2018-09-25 ENCOUNTER — Other Ambulatory Visit: Payer: Self-pay | Admitting: Obstetrics & Gynecology

## 2018-09-25 DIAGNOSIS — R1032 Left lower quadrant pain: Secondary | ICD-10-CM

## 2018-09-25 NOTE — Telephone Encounter (Signed)
Patient is schedule 09/28/18

## 2018-09-25 NOTE — Telephone Encounter (Signed)
Since severe, sch for appt w GYN [place] first this week

## 2018-09-25 NOTE — Telephone Encounter (Signed)
Pt having severe LLQ pain. Dr. Kenton Kingfisher pt. Pls call.

## 2018-09-28 ENCOUNTER — Ambulatory Visit: Payer: Self-pay | Admitting: Obstetrics & Gynecology

## 2018-09-28 ENCOUNTER — Ambulatory Visit: Payer: Self-pay | Admitting: Obstetrics and Gynecology

## 2018-09-28 ENCOUNTER — Other Ambulatory Visit: Payer: Self-pay

## 2019-07-30 ENCOUNTER — Ambulatory Visit: Payer: Self-pay | Admitting: Obstetrics and Gynecology

## 2020-04-02 ENCOUNTER — Other Ambulatory Visit: Payer: Self-pay

## 2020-04-02 ENCOUNTER — Emergency Department: Payer: Self-pay

## 2020-04-02 ENCOUNTER — Emergency Department
Admission: EM | Admit: 2020-04-02 | Discharge: 2020-04-02 | Disposition: A | Discharge status: Home / Self Care | Payer: Self-pay | Attending: Emergency Medicine | Admitting: Emergency Medicine

## 2020-04-02 DIAGNOSIS — N83291 Other ovarian cyst, right side: Secondary | ICD-10-CM | POA: Insufficient documentation

## 2020-04-02 DIAGNOSIS — R102 Pelvic and perineal pain: Secondary | ICD-10-CM

## 2020-04-02 DIAGNOSIS — N83201 Unspecified ovarian cyst, right side: Secondary | ICD-10-CM

## 2020-04-02 LAB — CBC
HCT: 38.8 % (ref 36.0–46.0)
Hemoglobin: 12.9 g/dL (ref 12.0–15.0)
MCH: 28.7 pg (ref 26.0–34.0)
MCHC: 33.2 g/dL (ref 30.0–36.0)
MCV: 86.4 fL (ref 80.0–100.0)
Platelets: 175 10*3/uL (ref 150–400)
RBC: 4.49 MIL/uL (ref 3.87–5.11)
RDW: 13.4 % (ref 11.5–15.5)
WBC: 7 10*3/uL (ref 4.0–10.5)
nRBC: 0 % (ref 0.0–0.2)

## 2020-04-02 LAB — WET PREP, GENITAL
Clue Cells Wet Prep HPF POC: NONE SEEN
Sperm: NONE SEEN
Trich, Wet Prep: NONE SEEN
Yeast Wet Prep HPF POC: NONE SEEN

## 2020-04-02 LAB — BASIC METABOLIC PANEL
Anion gap: 11 (ref 5–15)
BUN: 9 mg/dL (ref 6–20)
CO2: 25 mmol/L (ref 22–32)
Calcium: 9.1 mg/dL (ref 8.9–10.3)
Chloride: 102 mmol/L (ref 98–111)
Creatinine, Ser: 0.74 mg/dL (ref 0.44–1.00)
GFR, Estimated: >60 mL/min (ref >60)
Glucose, Bld: 169 mg/dL — ABNORMAL HIGH (ref 70–99)
Potassium: 3.4 mmol/L — ABNORMAL LOW (ref 3.5–5.1)
Sodium: 138 mmol/L (ref 135–145)

## 2020-04-02 LAB — POCT PREGNANCY, URINE: Preg Test, Ur: NEGATIVE

## 2020-04-02 LAB — URINALYSIS, COMPLETE (UACMP) WITH MICROSCOPIC
Bilirubin Urine: NEGATIVE
Glucose, UA: NEGATIVE mg/dL
Hgb urine dipstick: NEGATIVE
Ketones, ur: NEGATIVE mg/dL
Leukocytes,Ua: NEGATIVE
Nitrite: NEGATIVE
Protein, ur: NEGATIVE mg/dL
Specific Gravity, Urine: 1.003 — ABNORMAL LOW (ref 1.005–1.030)
pH: 6 (ref 5.0–8.0)

## 2020-04-02 LAB — CHLAMYDIA/NGC RT PCR (ARMC ONLY)
Chlamydia Tr: NOT DETECTED
N gonorrhoeae: NOT DETECTED

## 2020-04-02 MED ORDER — KETOROLAC TROMETHAMINE 30 MG/ML IJ SOLN
30.0000 mg | Freq: Once | INTRAMUSCULAR | Status: AC
  Administered 2020-04-02: 30 mg via INTRAMUSCULAR
  Filled 2020-04-02: qty 1

## 2020-04-02 MED ORDER — ONDANSETRON HCL 4 MG/2ML IJ SOLN: 4.0000 mg | Freq: Once | INTRAMUSCULAR | Status: DC

## 2020-04-02 MED ORDER — LACTATED RINGERS IV BOLUS
1000.0000 mL | Freq: Once | INTRAVENOUS | Status: DC
Start: 2020-04-02 — End: 2020-04-02

## 2020-04-02 MED ORDER — KETOROLAC TROMETHAMINE 30 MG/ML IJ SOLN: 15.0000 mg | Freq: Once | INTRAMUSCULAR | Status: DC

## 2020-04-02 NOTE — ED Notes (Signed)
Pt refused IV , LR, and zofran MD advised

## 2020-04-02 NOTE — ED Triage Notes (Signed)
Pt here for abdominal pain and pelvic pain.

## 2020-04-02 NOTE — ED Provider Notes (Signed)
[facility] Emergency Department Provider Note   ____________________________________________   First MD Initiated Contact with Patient 04/02/20 1519     (approximate)  I have reviewed the triage vital signs and the nursing notes.   HISTORY  Chief Complaint Abdominal Pain and Flank Pain    HPI Toni Carter is a 29 y.o. female with no significant past medical history presents the ED complaining of abdominal pain.  Patient reports that she has been dealing with left lower quadrant and left pelvic pain intermittently since last night.  She states the pain is severe when it comes on but is not exacerbated or alleviated by anything in particular.  She has some discomfort in her left lower back and left flank, but denies any dysuria or hematuria.  Her LMP was September 16 and she denies any vaginal bleeding or discharge.  She has never had similar symptoms in the past, denies any history of kidney stones.  She is status post tubal ligation.        Past Medical History:  Diagnosis Date  . Elevated liver enzymes    liver panel normal on 07-02-15  . Headache   . Hx MRSA infection   . Torn meniscus    left     Patient Active Problem List   Diagnosis Date Noted  . Menorrhagia with regular cycle 06/29/2017  . Dysmenorrhea 06/29/2017  . Abnormal uterine bleeding 07/04/2015    Past Surgical History:  Procedure Laterality Date  . DILATATION & CURETTAGE/HYSTEROSCOPY WITH MYOSURE N/A 07/04/2015   Procedure: DILATATION & CURETTAGE/HYSTEROSCOPY WITH MYOSURE;  Surgeon: Gae Dry, MD;  Location: [facility] ORS;  Service: Gynecology;  Laterality: N/A;  . FINGER TENDON REPAIR Left 10/2016  . TUBAL LIGATION Bilateral 03/21/2015   Procedure: POST PARTUM TUBAL LIGATION;  Surgeon: Malachy Mood, MD;  Location: [facility] ORS;  Service: Gynecology;  Laterality: Bilateral;    Prior to Admission medications   Medication Sig Start Date End Date Taking? Authorizing Provider    Diphenhyd-Hydrocort-Nystatin (FIRST-DUKES MOUTHWASH) SUSP Use as directed 5 mLs in the mouth or throat 4 (four) times daily -  before meals and at bedtime. Patient not taking: Reported on 06/29/2017 06/24/16   Johnn Hai, PA-C  Multiple Vitamin (MULTIVITAMIN) tablet Take 1 tablet by mouth daily.    [provider]    Allergies Morphine and related, Hydrocodone, Tylenol [acetaminophen], Tape, and Vicodin [hydrocodone-acetaminophen]  Family History  Problem Relation Age of Onset  . Lung cancer Father   . Cancer Father     Social History Social History   Tobacco Use  . Smoking status: Never Smoker  . Smokeless tobacco: Never Used  Substance Use Topics  . Alcohol use: Yes    Comment: RARE  . Drug use: No    Review of Systems  Constitutional: No fever/chills Eyes: No visual changes. ENT: No sore throat. Cardiovascular: Denies chest pain. Respiratory: Denies shortness of breath. Gastrointestinal: Positive for pelvic and abdominal pain.  No nausea, no vomiting.  No diarrhea.  No constipation. Genitourinary: Negative for dysuria. Musculoskeletal: Negative for back pain. Skin: Negative for rash. Neurological: Negative for headaches, focal weakness or numbness.  ____________________________________________   PHYSICAL EXAM:  VITAL SIGNS: ED Triage Vitals  Enc Vitals Group     BP 04/02/20 1340 137/69     Pulse Rate 04/02/20 1340 (!) 55     Resp 04/02/20 1340 18     Temp 04/02/20 1340 98.5 F (36.9 C)     Temp  Source 04/02/20 1340 Oral     SpO2 04/02/20 1340 98 %     Weight 04/02/20 1341 250 lb (113.4 kg)     Height 04/02/20 1341 5\' 10"  (1.778 m)     Head Circumference --      Peak Flow --      Pain Score 04/02/20 1341 9     Pain Loc --      Pain Edu? --      Excl. in [place]? --     Constitutional: Alert and oriented. Eyes: Conjunctivae are normal. Head: Atraumatic. Nose: No congestion/rhinnorhea. Mouth/Throat: Mucous membranes are moist. Neck: Normal  ROM Cardiovascular: Normal rate, regular rhythm. Grossly normal heart sounds. Respiratory: Normal respiratory effort.  No retractions. Lungs CTAB. Gastrointestinal: Soft and nontender. No distention. Genitourinary: deferred Musculoskeletal: No lower extremity tenderness nor edema. Neurologic:  Normal speech and language. No gross focal neurologic deficits are appreciated. Skin:  Skin is warm, dry and intact. No rash noted. Psychiatric: Mood and affect are normal. Speech and behavior are normal.  ____________________________________________   LABS (all labs ordered are listed, but only abnormal results are displayed)  Labs Reviewed  WET PREP, GENITAL - Abnormal; Notable for the following components:      Result Value   WBC, Wet Prep HPF POC RARE (*)    All other components within normal limits  URINALYSIS, COMPLETE (UACMP) WITH MICROSCOPIC - Abnormal; Notable for the following components:   Color, Urine STRAW (*)    APPearance CLEAR (*)    Specific Gravity, Urine 1.003 (*)    Bacteria, UA RARE (*)    All other components within normal limits  BASIC METABOLIC PANEL - Abnormal; Notable for the following components:   Potassium 3.4 (*)    Glucose, Bld 169 (*)    All other components within normal limits  CHLAMYDIA/NGC RT PCR ([facility] ONLY)  CBC  POCT PREGNANCY, URINE  POC URINE PREG, ED    PROCEDURES  Procedure(s) performed (including Critical Care):  Procedures   ____________________________________________   INITIAL IMPRESSION / ASSESSMENT AND PLAN / ED COURSE       29 year old female with no significant past medical history presents to the ED with intermittent left lower quadrant and left pelvic pain since last night associated with some nausea and left flank pain.  She has no focal abdominal tenderness on exam, declines pelvic exam and prefers to perform self swab.  Pregnancy testing is negative and UA does not appear consistent with UTI.  I have a lower  suspicion for kidney stone as there is no blood in her urine.  We will assess with ultrasound to rule out torsion or rupturing ovarian cyst.  No tenderness in her right lower quadrant to suggest appendicitis.  We will treat symptomatically with IV Toradol, Zofran, and fluids.  Patient declines IV placement and alternatively was given IM Toradol.  She subsequently reported significant improvement in pain.  Ultrasound was performed and shows bilateral ovarian cystic structures with no evidence of torsion or other acute process.  Her pain could be related to these cysts and given reassuring work-up, she is appropriate for discharge home with OB/GYN follow-up.  She was counseled to return to the ED for new worsening symptoms, patient agrees with plan.      ____________________________________________   FINAL CLINICAL IMPRESSION(S) / ED DIAGNOSES  Final diagnoses:  Pelvic pain  Bilateral ovarian cysts     ED Discharge Orders    None       Note:  This document was prepared using Dragon voice recognition software and may include unintentional dictation errors.   Blake Divine, MD 04/02/20 [phone]

## 2020-04-02 NOTE — ED Triage Notes (Signed)
Pt comes POV for left lower abdominal and flank pain. Thinks it may be her ovary but isn't sure. Pt has never had a kidney stone.

## 2020-04-02 NOTE — ED Notes (Signed)
Discharge instructions reviewed with pt. PT AOX4, denied pain or sob. Ambulatory upon discharge.

## 2020-04-02 NOTE — ED Notes (Signed)
Pt taken to ultrasound

## 2020-12-09 ENCOUNTER — Emergency Department
Admission: EM | Admit: 2020-12-09 | Discharge: 2020-12-09 | Disposition: A | Discharge status: Home / Self Care | Payer: No Typology Code available for payment source | Attending: Emergency Medicine | Admitting: Emergency Medicine

## 2020-12-09 ENCOUNTER — Other Ambulatory Visit: Payer: Self-pay

## 2020-12-09 ENCOUNTER — Encounter: Payer: Self-pay | Admitting: Emergency Medicine

## 2020-12-09 DIAGNOSIS — L03031 Cellulitis of right toe: Secondary | ICD-10-CM | POA: Diagnosis not present

## 2020-12-09 DIAGNOSIS — R509 Fever, unspecified: Secondary | ICD-10-CM | POA: Insufficient documentation

## 2020-12-09 DIAGNOSIS — L02611 Cutaneous abscess of right foot: Secondary | ICD-10-CM | POA: Insufficient documentation

## 2020-12-09 DIAGNOSIS — R519 Headache, unspecified: Secondary | ICD-10-CM | POA: Diagnosis not present

## 2020-12-09 MED ORDER — OXYCODONE-ACETAMINOPHEN 5-325 MG PO TABS
1.0000 | ORAL_TABLET | Freq: Once | ORAL | Status: AC
  Administered 2020-12-09: 1 via ORAL
  Filled 2020-12-09: qty 1

## 2020-12-09 MED ORDER — ONDANSETRON 4 MG PO TBDP: 4.0000 mg | ORAL_TABLET | Freq: Three times a day (TID) | ORAL | 0 refills | Status: AC | PRN

## 2020-12-09 MED ORDER — CEPHALEXIN 500 MG PO CAPS: 500.0000 mg | ORAL_CAPSULE | Freq: Four times a day (QID) | ORAL | 0 refills | Status: DC

## 2020-12-09 MED ORDER — ONDANSETRON 4 MG PO TBDP
4.0000 mg | ORAL_TABLET | Freq: Once | ORAL | Status: AC
  Administered 2020-12-09: 4 mg via ORAL
  Filled 2020-12-09: qty 1

## 2020-12-09 MED ORDER — OXYCODONE-ACETAMINOPHEN 5-325 MG PO TABS: 1.0000 | ORAL_TABLET | Freq: Three times a day (TID) | ORAL | 0 refills | Status: AC | PRN

## 2020-12-09 MED ORDER — CEFTRIAXONE SODIUM 1 G IJ SOLR
1.0000 g | Freq: Once | INTRAMUSCULAR | Status: AC
  Administered 2020-12-09: 1 g via INTRAMUSCULAR
  Filled 2020-12-09: qty 10

## 2020-12-09 MED ORDER — CEPHALEXIN 500 MG PO CAPS: 500.0000 mg | ORAL_CAPSULE | Freq: Four times a day (QID) | ORAL | 0 refills | Status: AC

## 2020-12-09 NOTE — Discharge Instructions (Addendum)
Continue taking Bactrim Please also start taking Keflex 4 times daily for the next 7 days. You have been prescribed Percocet for pain.  Please take Zofran with the Percocet in order to avoid nausea. You can keep follow-up appointment on Friday for reevaluation. If your symptoms worsen before Friday, please return to the emergency department for reevaluation.

## 2020-12-09 NOTE — ED Triage Notes (Signed)
Pt to ED from home c/o right foot pain.  States currently being treated for MRSA in foot, has follow up on Friday but states couldn't wait d/t pain.  Also c/o fever at home and headache.  Pt last took IBU around 1900.  Signature pad unable to reach pt.  Pt understands MSE waiver.

## 2020-12-09 NOTE — ED Notes (Signed)
Patient declined discharge vital signs. 

## 2020-12-09 NOTE — ED Provider Notes (Signed)
[facility]EM[facility]ENCY DEPARTMENT  ____________________________________________  Time seen: Approximately 10:27 PM  I have reviewed the triage vital signs and the nursing notes.   HISTORY  Chief Complaint Wound Check   Historian Patient     HPI Toni Carter is a 30 y.o. female with a history of MRSA, presents to the emergency department with right fifth toe cellulitis.  Patient was seen and evaluated by her primary care provider and was started on Bactrim.  Patient has had 3 doses.  She states that she has had some low-grade fever and headache at home.  Patient comes into the emergency department tonight as she reports pain is worsening.  She has not noticed any worsening erythema.  Patient states that she developed some tenderness along the right fifth toe last week and 2 to 3 days ago noticed a small pustule along the plantar aspect of the digit that has been draining.  Patient is required admission for MRSA infections in the past.   Past Medical History:  Diagnosis Date   Elevated liver enzymes    liver panel normal on 07-02-15   Headache    Hx MRSA infection    Torn meniscus    left      Immunizations up to date:  Yes.     Past Medical History:  Diagnosis Date   Elevated liver enzymes    liver panel normal on 07-02-15   Headache    Hx MRSA infection    Torn meniscus    left     Patient Active Problem List   Diagnosis Date Noted   Menorrhagia with regular cycle 06/29/2017   Dysmenorrhea 06/29/2017   Abnormal uterine bleeding 07/04/2015    Past Surgical History:  Procedure Laterality Date   DILATATION & CURETTAGE/HYSTEROSCOPY WITH MYOSURE N/A 07/04/2015   Procedure: DILATATION & CURETTAGE/HYSTEROSCOPY WITH MYOSURE;  Surgeon: Gae Dry, MD;  Location: [facility] ORS;  Service: Gynecology;  Laterality: N/A;   FINGER TENDON REPAIR Left 10/2016   TUBAL LIGATION Bilateral 03/21/2015   Procedure: POST PARTUM TUBAL LIGATION;  Surgeon: Malachy Mood, MD;  Location:  [facility] ORS;  Service: Gynecology;  Laterality: Bilateral;    Prior to Admission medications   Medication Sig Start Date End Date Taking? Authorizing Provider  ondansetron (ZOFRAN ODT) 4 MG disintegrating tablet Take 1 tablet (4 mg total) by mouth every 8 (eight) hours as needed for up to 3 days. 12/09/20 12/12/20 Yes Vallarie Mare M, PA-C  oxyCODONE-acetaminophen (PERCOCET/ROXICET) 5-325 MG tablet Take 1 tablet by mouth every 8 (eight) hours as needed for up to 3 days. 12/09/20 12/12/20 Yes Vallarie Mare M, PA-C  cephALEXin (KEFLEX) 500 MG capsule Take 1 capsule (500 mg total) by mouth 4 (four) times daily for 7 days. 12/09/20 12/16/20  Lannie Fields, PA-C  Diphenhyd-Hydrocort-Nystatin (FIRST-DUKES MOUTHWASH) SUSP Use as directed 5 mLs in the mouth or throat 4 (four) times daily -  before meals and at bedtime. Patient not taking: Reported on 06/29/2017 06/24/16   Johnn Hai, PA-C  Multiple Vitamin (MULTIVITAMIN) tablet Take 1 tablet by mouth daily.    [provider]    Allergies Morphine and related, Hydrocodone, Tylenol [acetaminophen], Tape, and Vicodin [hydrocodone-acetaminophen]  Family History  Problem Relation Age of Onset   Lung cancer Father    Cancer Father     Social History Social History   Tobacco Use   Smoking status: Never   Smokeless tobacco: Never  Substance Use Topics   Alcohol use: Yes    Comment:  RARE   Drug use: No     Review of Systems  Constitutional: No fever/chills Eyes:  No discharge ENT: No upper respiratory complaints. Respiratory: no cough. No SOB/ use of accessory muscles to breath Gastrointestinal:   No nausea, no vomiting.  No diarrhea.  No constipation. Musculoskeletal: Negative for musculoskeletal pain. Skin: Patient has right great toe cellulitis.   ____________________________________________   PHYSICAL EXAM:  VITAL SIGNS: ED Triage Vitals [12/09/20 2151]  Enc Vitals Group     BP (!) 143/75     Pulse Rate 73     Resp 16      Temp 99.1 F (37.3 C)     Temp Source Oral     SpO2 97 %     Weight 258 lb (117 kg)     Height 5\' 10"  (1.778 m)     Head Circumference      Peak Flow      Pain Score 8     Pain Loc      Pain Edu?      Excl. in [place]?      Constitutional: Alert and oriented. Well appearing and in no acute distress. Eyes: Conjunctivae are normal. PERRL. EOMI. Head: Atraumatic. ENT:      Nose: No congestion/rhinnorhea.      Mouth/Throat: Mucous membranes are moist.  Neck: No stridor.  No cervical spine tenderness to palpation. Cardiovascular: Normal rate, regular rhythm. Normal S1 and S2.  Good peripheral circulation. Respiratory: Normal respiratory effort without tachypnea or retractions. Lungs CTAB. Good air entry to the bases with no decreased or absent breath sounds Gastrointestinal: Bowel sounds x 4 quadrants. Soft and nontender to palpation. No guarding or rigidity. No distention. Musculoskeletal: Full range of motion to all extremities. No obvious deformities noted Neurologic:  Normal for age. No gross focal neurologic deficits are appreciated.  Skin: Patient has erythema and edema of the right fifth toe.  Patient has a small amount of expressed purulence along the plantar aspect of the digit. Psychiatric: Mood and affect are normal for age. Speech and behavior are normal.   ____________________________________________   LABS (all labs ordered are listed, but only abnormal results are displayed)  Labs Reviewed - No data to display ____________________________________________  EKG   ____________________________________________  RADIOLOGY   No results found.  ____________________________________________    PROCEDURES  Procedure(s) performed:     Procedures     Medications  cefTRIAXone (ROCEPHIN) injection 1 g (1 g Intramuscular Given 12/09/20 2234)  oxyCODONE-acetaminophen (PERCOCET/ROXICET) 5-325 MG per tablet 1 tablet (1 tablet Oral Given 12/09/20 2235)   ondansetron (ZOFRAN-ODT) disintegrating tablet 4 mg (4 mg Oral Given 12/09/20 2235)     ____________________________________________   INITIAL IMPRESSION / ASSESSMENT AND PLAN / ED COURSE  Pertinent labs & imaging results that were available during my care of the patient were reviewed by me and considered in my medical decision making (see chart for details).    Assessment and plan Toe cellulitis 30 year old female presents to the emergency department with spontaneously draining right fifth toe wound and cellulitis.  Vital signs are reassuring at triage.  On physical exam, patient has cellulitis of the right fifth toe with spontaneous drainage.  Encouraged drainage from the wound while in the emergency department.  Patient has only had 3 doses of Septra so far.  Patient was given IM Rocephin in the emergency department and also started on Keflex 4 times daily.  Patient has an appointment with her PCP on Friday for reevaluation.  Recommended return precautions to return with new or worsening symptoms.  Patient was discharged with a 3-day supply of Percocet.      ____________________________________________  FINAL CLINICAL IMPRESSION(S) / ED DIAGNOSES  Final diagnoses:  Abscess or cellulitis of toe, right      NEW MEDICATIONS STARTED DURING THIS VISIT:  ED Discharge Orders          Ordered    cephALEXin (KEFLEX) 500 MG capsule  4 times daily,   Status:  Discontinued        12/09/20 2241    cephALEXin (KEFLEX) 500 MG capsule  4 times daily        12/09/20 2243    oxyCODONE-acetaminophen (PERCOCET/ROXICET) 5-325 MG tablet  Every 8 hours PRN        12/09/20 2243    ondansetron (ZOFRAN ODT) 4 MG disintegrating tablet  Every 8 hours PRN        12/09/20 2243                This chart was dictated using voice recognition software/Dragon. Despite best efforts to proofread, errors can occur which can change the meaning. Any change was purely unintentional.      Karren Cobble 12/09/20 2311    Merlyn Lot, MD 12/10/20 1037

## 2020-12-10 ENCOUNTER — Other Ambulatory Visit: Payer: Self-pay

## 2020-12-10 ENCOUNTER — Emergency Department
Admission: EM | Admit: 2020-12-10 | Discharge: 2020-12-10 | Disposition: A | Discharge status: Home / Self Care | Payer: No Typology Code available for payment source | Attending: Emergency Medicine | Admitting: Emergency Medicine

## 2020-12-10 DIAGNOSIS — Z4800 Encounter for change or removal of nonsurgical wound dressing: Secondary | ICD-10-CM | POA: Diagnosis present

## 2020-12-10 DIAGNOSIS — Z5189 Encounter for other specified aftercare: Secondary | ICD-10-CM

## 2020-12-10 LAB — COMPREHENSIVE METABOLIC PANEL
ALT: 23 U/L (ref 0–44)
AST: 18 U/L (ref 15–41)
Albumin: 4.2 g/dL (ref 3.5–5.0)
Alkaline Phosphatase: 74 U/L (ref 38–126)
Anion gap: 6 (ref 5–15)
BUN: 11 mg/dL (ref 6–20)
CO2: 23 mmol/L (ref 22–32)
Calcium: 8.9 mg/dL (ref 8.9–10.3)
Chloride: 108 mmol/L (ref 98–111)
Creatinine, Ser: 0.7 mg/dL (ref 0.44–1.00)
GFR, Estimated: >60 mL/min (ref >60)
Glucose, Bld: 109 mg/dL — ABNORMAL HIGH (ref 70–99)
Potassium: 4.1 mmol/L (ref 3.5–5.1)
Sodium: 137 mmol/L (ref 135–145)
Total Bilirubin: 0.6 mg/dL (ref 0.3–1.2)
Total Protein: 7.4 g/dL (ref 6.5–8.1)

## 2020-12-10 LAB — CBC
HCT: 39.6 % (ref 36.0–46.0)
Hemoglobin: 13.3 g/dL (ref 12.0–15.0)
MCH: 28.8 pg (ref 26.0–34.0)
MCHC: 33.6 g/dL (ref 30.0–36.0)
MCV: 85.7 fL (ref 80.0–100.0)
Platelets: 193 10*3/uL (ref 150–400)
RBC: 4.62 MIL/uL (ref 3.87–5.11)
RDW: 14 % (ref 11.5–15.5)
WBC: 8.2 10*3/uL (ref 4.0–10.5)
nRBC: 0 % (ref 0.0–0.2)

## 2020-12-10 LAB — LACTIC ACID, PLASMA: Lactic Acid, Venous: 1 mmol/L (ref 0.5–1.9)

## 2020-12-10 NOTE — ED Triage Notes (Signed)
Pt comes into the ED via POV c/o right pinky toe pain and discoloration.  Pt ambulatory to triage at this time.  Pt seen last night and has recent dx with MRSA.

## 2020-12-10 NOTE — ED Notes (Signed)
Patient returning to the ED today to see [place], [place]. Patient reports the medication that was prescribed to her yesterday is not working. Patient reports pain to the big toe on the right foot after an I&D yesterday, and a dx of MRSA.

## 2020-12-10 NOTE — ED Provider Notes (Signed)
[facility]  ____________________________________________  Time seen: Approximately 6:25 PM  I have reviewed the triage vital signs and the nursing notes.   HISTORY  Chief Complaint Toe Pain   Historian Patient    HPI Toni Carter is a 30 y.o. female presents to the emergency department for a right fifth toe wound check.  Patient's erythema has improved today as well as her pain.  Patient is presenting as she has felt "sleepy and jumpy" at home.  She denies cough, shortness of breath, flank pain, vomiting, diarrhea, rash or syncope.  Patient states that she took 1 dose of Percocet this morning but has not had additional pain medication.   Past Medical History:  Diagnosis Date   Elevated liver enzymes    liver panel normal on 07-02-15   Headache    Hx MRSA infection    Torn meniscus    left      Immunizations up to date:  Yes.     Past Medical History:  Diagnosis Date   Elevated liver enzymes    liver panel normal on 07-02-15   Headache    Hx MRSA infection    Torn meniscus    left     Patient Active Problem List   Diagnosis Date Noted   Menorrhagia with regular cycle 06/29/2017   Dysmenorrhea 06/29/2017   Abnormal uterine bleeding 07/04/2015    Past Surgical History:  Procedure Laterality Date   DILATATION & CURETTAGE/HYSTEROSCOPY WITH MYOSURE N/A 07/04/2015   Procedure: DILATATION & CURETTAGE/HYSTEROSCOPY WITH MYOSURE;  Surgeon: Gae Dry, MD;  Location: [facility];  Service: Gynecology;  Laterality: N/A;   FINGER TENDON REPAIR Left 10/2016   TUBAL LIGATION Bilateral 03/21/2015   Procedure: POST PARTUM TUBAL LIGATION;  Surgeon: Malachy Mood, MD;  Location: [facility];  Service: Gynecology;  Laterality: Bilateral;    Prior to Admission medications   Medication Sig Start Date End Date Taking? Authorizing Provider  cephALEXin (KEFLEX) 500 MG capsule Take 1 capsule (500 mg total) by mouth 4 (four) times daily for 7 days. 12/09/20  12/16/20  Lannie Fields, PA-C  Diphenhyd-Hydrocort-Nystatin (FIRST-DUKES MOUTHWASH) SUSP Use as directed 5 mLs in the mouth or throat 4 (four) times daily -  before meals and at bedtime. Patient not taking: Reported on 06/29/2017 06/24/16   Johnn Hai, PA-C  Multiple Vitamin (MULTIVITAMIN) tablet Take 1 tablet by mouth daily.    [provider]  ondansetron (ZOFRAN ODT) 4 MG disintegrating tablet Take 1 tablet (4 mg total) by mouth every 8 (eight) hours as needed for up to 3 days. 12/09/20 12/12/20  Lannie Fields, PA-C  oxyCODONE-acetaminophen (PERCOCET/ROXICET) 5-325 MG tablet Take 1 tablet by mouth every 8 (eight) hours as needed for up to 3 days. 12/09/20 12/12/20  Lannie Fields, PA-C    Allergies Morphine and related, Hydrocodone, Tylenol [acetaminophen], Tape, and Vicodin [hydrocodone-acetaminophen]  Family History  Problem Relation Age of Onset   Lung cancer Father    Cancer Father     Social History Social History   Tobacco Use   Smoking status: Never   Smokeless tobacco: Never  Substance Use Topics   Alcohol use: Yes    Comment: RARE   Drug use: No     Review of Systems  Constitutional: No fever/chills Eyes:  No discharge ENT: No upper respiratory complaints. Respiratory: no cough. No SOB/ use of accessory muscles to breath Gastrointestinal:   No nausea, no vomiting.  No diarrhea.  No constipation. Musculoskeletal: Patient  has right fifth toe pain.  Skin: Negative for rash, abrasions, lacerations, ecchymosis.    ____________________________________________   PHYSICAL EXAM:  VITAL SIGNS: ED Triage Vitals  Enc Vitals Group     BP 12/10/20 1732 121/71     Pulse Rate 12/10/20 1732 (!) 56     Resp 12/10/20 1732 20     Temp 12/10/20 1732 98.6 F (37 C)     Temp Source 12/10/20 1732 Oral     SpO2 12/10/20 1732 98 %     Weight 12/10/20 1729 257 lb 15 oz (117 kg)     Height 12/10/20 1729 5\' 10"  (1.778 m)     Head Circumference --      Peak Flow  --      Pain Score 12/10/20 1729 0     Pain Loc --      Pain Edu? --      Excl. in [place]? --      Constitutional: Alert and oriented. Well appearing and in no acute distress. Eyes: Conjunctivae are normal. PERRL. EOMI. Head: Atraumatic. ENT: Cardiovascular: Normal rate, regular rhythm. Normal S1 and S2.  Good peripheral circulation. Respiratory: Normal respiratory effort without tachypnea or retractions. Lungs CTAB. Good air entry to the bases with no decreased or absent breath sounds Gastrointestinal: Bowel sounds x 4 quadrants. Soft and nontender to palpation. No guarding or rigidity. No distention. Musculoskeletal: Full range of motion to all extremities. No obvious deformities noted.  Palpable dorsalis pedis pulse, right.  Capillary refill less than 2 seconds on the right. Neurologic:  Normal for age. No gross focal neurologic deficits are appreciated.  Skin: Erythema and edema of right fifth toe had improved since last night. Psychiatric: Mood and affect are normal for age. Speech and behavior are normal.   ____________________________________________   LABS (all labs ordered are listed, but only abnormal results are displayed)  Labs Reviewed  COMPREHENSIVE METABOLIC PANEL - Abnormal; Notable for the following components:      Result Value   Glucose, Bld 109 (*)    All other components within normal limits  CBC  LACTIC ACID, PLASMA  LACTIC ACID, PLASMA   ____________________________________________  EKG   ____________________________________________  RADIOLOGY   No results found.  ____________________________________________    PROCEDURES  Procedure(s) performed:     Procedures     Medications - No data to display   ____________________________________________   INITIAL IMPRESSION / ASSESSMENT AND PLAN / ED COURSE  Pertinent labs & imaging results that were available during my care of the patient were reviewed by me and considered in my medical  decision making (see chart for details).      Assessment and plan Visit for wound check 74-year-old female presents to the emergency department to have right fifth toe reassessed after patient has felt tired and jumpy today.  Patient was mildly bradycardic at triage but vital signs were otherwise reassuring.  On physical exam, right fifth toe erythema and edema improved significantly.  Patient reported that her pain had improved as well  Basic labs, CBC and CMP were reassuring.  Lactate was within reference range.  Recommended continuing Bactrim and Keflex at home as directed.  Patient has follow-up appointment with her primary care provider in 5 and recommended keeping appointment.  Return precautions were given to return with new or worsening symptoms.  All patient questions were answered.     ____________________________________________  FINAL CLINICAL IMPRESSION(S) / ED DIAGNOSES  Final diagnoses:  Visit for wound check  NEW MEDICATIONS STARTED DURING THIS VISIT:  ED Discharge Orders     None           This chart was dictated using voice recognition software/Dragon. Despite best efforts to proofread, errors can occur which can change the meaning. Any change was purely unintentional.     Lannie Fields, PA-C 12/10/20 [place]    Merlyn Lot, MD 12/10/20 1850

## 2020-12-10 NOTE — ED Notes (Signed)
Pt left without signing chart for d/c inst.

## 2020-12-10 NOTE — Discharge Instructions (Addendum)
Continue taking Bactrim and Keflex as directed.

## 2020-12-26 ENCOUNTER — Other Ambulatory Visit: Payer: Self-pay

## 2020-12-26 ENCOUNTER — Emergency Department
Admission: EM | Admit: 2020-12-26 | Discharge: 2020-12-26 | Disposition: A | Discharge status: Home / Self Care | Payer: No Typology Code available for payment source | Attending: Emergency Medicine | Admitting: Emergency Medicine

## 2020-12-26 ENCOUNTER — Encounter: Payer: Self-pay | Admitting: Emergency Medicine

## 2020-12-26 DIAGNOSIS — W57XXXA Bitten or stung by nonvenomous insect and other nonvenomous arthropods, initial encounter: Secondary | ICD-10-CM | POA: Diagnosis not present

## 2020-12-26 DIAGNOSIS — S40861A Insect bite (nonvenomous) of right upper arm, initial encounter: Secondary | ICD-10-CM | POA: Diagnosis present

## 2020-12-26 MED ORDER — DEXAMETHASONE SODIUM PHOSPHATE 10 MG/ML IJ SOLN
10.0000 mg | Freq: Once | INTRAMUSCULAR | Status: AC
  Administered 2020-12-26: 10 mg via INTRAMUSCULAR
  Filled 2020-12-26: qty 1

## 2020-12-26 MED ORDER — FAMOTIDINE 20 MG PO TABS
20.0000 mg | ORAL_TABLET | Freq: Once | ORAL | Status: AC
  Administered 2020-12-26: 20 mg via ORAL
  Filled 2020-12-26: qty 1

## 2020-12-26 MED ORDER — PREDNISONE 10 MG (21) PO TBPK: ORAL_TABLET | ORAL | 0 refills | Status: AC

## 2020-12-26 NOTE — ED Triage Notes (Signed)
Bitten by something this morning at 0830.  Benadryl taken earlier.  Three red areas noted to right upper arm.  AAOx3.  Skin warm and dry. NAD

## 2020-12-26 NOTE — Discharge Instructions (Addendum)
Follow-up with your regular doctor if not improving in 2 to 3 days.  Return emergency department worsening.  Take the steroid as prescribed.  Continue to take Benadryl.  You can also take over-the-counter Pepcid.  Apply ice to the area.

## 2020-12-26 NOTE — ED Provider Notes (Signed)
[facility] Emergency Department Provider Note  ____________________________________________   Event Date/Time   First MD Initiated Contact with Patient 12/26/20 1354     (approximate)  I have reviewed the triage vital signs and the nursing notes.   HISTORY  Chief Complaint Allergic Reaction    HPI Toni Carter is a 30 y.o. female presents emergency department after having a insect bite on her right upper arm earlier today.  Patient states she circled the area but now the redness has enlarged and is still itching.  She took Benadryl without much relief.  She wants to know if I can tell her exactly what insect bit her.  Past Medical History:  Diagnosis Date   Elevated liver enzymes    liver panel normal on 07-02-15   Headache    Hx MRSA infection    Torn meniscus    left     Patient Active Problem List   Diagnosis Date Noted   Menorrhagia with regular cycle 06/29/2017   Dysmenorrhea 06/29/2017   Abnormal uterine bleeding 07/04/2015    Past Surgical History:  Procedure Laterality Date   DILATATION & CURETTAGE/HYSTEROSCOPY WITH MYOSURE N/A 07/04/2015   Procedure: DILATATION & CURETTAGE/HYSTEROSCOPY WITH MYOSURE;  Surgeon: Gae Dry, MD;  Location: [facility] OR[facility];  Service: Gynecology;  Laterality: N/A;   FINGER TENDON REPAIR Left 10/2016   TUBAL LIGATION Bilateral 03/21/2015   Procedure: POST PARTUM TUBAL LIGATION;  Surgeon: Malachy Mood, MD;  Location: [facility] OR[facility];  Service: Gynecology;  Laterality: Bilateral;    Prior to Admission medications   Medication Sig Start Date End Date Taking? Authorizing Provider  predniSONE (STERAPRED UNI-PAK 21 TAB) 10 MG (21) TBPK tablet Take 6 pills on day one then decrease by 1 pill each day 12/26/20  Yes Giada Schoppe, Linden Dolin, PA-C  Diphenhyd-Hydrocort-Nystatin (FIRST-DUKES MOUTHWASH) SUSP Use as directed 5 mLs in the mouth or throat 4 (four) times daily -  before meals and at bedtime. Patient not taking: Reported  on 06/29/2017 06/24/16   Johnn Hai, PA-C  Multiple Vitamin (MULTIVITAMIN) tablet Take 1 tablet by mouth daily.    [provider]    Allergies Morphine and related, Hydrocodone, Tylenol [acetaminophen], Tape, and Vicodin [hydrocodone-acetaminophen]  Family History  Problem Relation Age of Onset   Lung cancer Father    Cancer Father     Social History Social History   Tobacco Use   Smoking status: Never   Smokeless tobacco: Never  Substance Use Topics   Alcohol use: Yes    Comment: RARE   Drug use: No    Review of Systems  Constitutional: No fever/chills Eyes: No visual changes. ENT: No sore throat. Respiratory: Denies cough Cardiovascular: Denies chest pain Gastrointestinal: Denies abdominal pain Genitourinary: Negative for dysuria. Musculoskeletal: Negative for back pain. Skin: Positive for rash. Psychiatric: no mood changes,     ____________________________________________   PHYSICAL EXAM:  VITAL SIGNS: ED Triage Vitals  Enc Vitals Group     BP 12/26/20 1316 116/85     Pulse Rate 12/26/20 1316 (!) 58     Resp 12/26/20 1316 18     Temp 12/26/20 1316 98.6 F (37 C)     Temp Source 12/26/20 1316 Oral     SpO2 12/26/20 1316 99 %     Weight 12/26/20 1312 257 lb 15 oz (117 kg)     Height 12/26/20 1312 5\' 10"  (1.778 m)     Head Circumference --      Peak Flow --  Pain Score 12/26/20 1312 0     Pain Loc --      Pain Edu? --      Excl. in [place]? --     Constitutional: Alert and oriented. Well appearing and in no acute distress. Eyes: Conjunctivae are normal.  Head: Atraumatic. Nose: No congestion/rhinnorhea. Mouth/Throat: Mucous membranes are moist.   Neck:  supple no lymphadenopathy noted Cardiovascular: Normal rate, regular rhythm. Heart sounds are normal Respiratory: Normal respiratory effort.  No retractions, lungs c t a  GU: deferred Musculoskeletal: FROM all extremities, warm and well perfused Neurologic:  Normal speech and  language.  Skin:  Skin is warm, dry and intact.  3 round raised lesions noted on the right upper arm, warm to touch, typical of a reaction to an insect sting.   Psychiatric: Mood and affect are normal. Speech and behavior are normal.  ____________________________________________   LABS (all labs ordered are listed, but only abnormal results are displayed)  Labs Reviewed - No data to display ____________________________________________   ____________________________________________  RADIOLOGY    ____________________________________________   PROCEDURES  Procedure(s) performed: Decadron 10 mg IM   Procedures    ____________________________________________   INITIAL IMPRESSION / ASSESSMENT AND PLAN / ED COURSE  Pertinent labs & imaging results that were available during my care of the patient were reviewed by me and considered in my medical decision making (see chart for details).   The patient is 30 year old female presents with insect bites to right upper arm.  See HPI.  Physical exam shows patient appears stable.  I did explain to the patient this is more of an allergic reaction to the insect.  I have no idea what kind of insect has bitten her.  She was given Decadron 10 mg IM, Pepcid 20 mg p.o.  She is to follow-up with her regular doctor if not improving to 3 days.  Given a prescription for Sterapred.  She is continue take Benadryl.  Apply ice to the area.  She is discharged stable condition.     Toni Carter was evaluated in Emergency Department on 12/26/2020 for the symptoms described in the history of present illness. She was evaluated in the context of the global COVID-19 pandemic, which necessitated consideration that the patient might be at risk for infection with the SARS-CoV-2 virus that causes COVID-19. Institutional protocols and algorithms that pertain to the evaluation of patients at risk for COVID-19 are in a state of rapid change based on information  released by regulatory bodies including the CDC and federal and state organizations. These policies and algorithms were followed during the patient's care in the ED.    As part of my medical decision making, I reviewed the following data within the [place] notes reviewed and incorporated, Old chart reviewed, Notes from prior ED visits, and [place] Controlled Substance Database  ____________________________________________   FINAL CLINICAL IMPRESSION(S) / ED DIAGNOSES  Final diagnoses:  Insect bite of right upper arm, initial encounter      NEW MEDICATIONS STARTED DURING THIS VISIT:  New Prescriptions   PREDNISONE (STERAPRED UNI-PAK 21 TAB) 10 MG (21) TBPK TABLET    Take 6 pills on day one then decrease by 1 pill each day     Note:  This document was prepared using Dragon voice recognition software and may include unintentional dictation errors.    Versie Starks, PA-C 12/26/20 1707    Nance Pear, MD 12/26/20 Drema Halon

## 2021-05-29 ENCOUNTER — Other Ambulatory Visit: Payer: Self-pay

## 2021-05-29 ENCOUNTER — Emergency Department
Admission: EM | Admit: 2021-05-29 | Discharge: 2021-05-30 | Disposition: A | Discharge status: Home / Self Care | Payer: No Typology Code available for payment source | Attending: Emergency Medicine | Admitting: Emergency Medicine

## 2021-05-29 ENCOUNTER — Emergency Department: Payer: No Typology Code available for payment source

## 2021-05-29 DIAGNOSIS — J069 Acute upper respiratory infection, unspecified: Secondary | ICD-10-CM | POA: Insufficient documentation

## 2021-05-29 DIAGNOSIS — J029 Acute pharyngitis, unspecified: Secondary | ICD-10-CM

## 2021-05-29 LAB — CBC
HCT: 38 % (ref 36.0–46.0)
Hemoglobin: 12.7 g/dL (ref 12.0–15.0)
MCH: 28.7 pg (ref 26.0–34.0)
MCHC: 33.4 g/dL (ref 30.0–36.0)
MCV: 86 fL (ref 80.0–100.0)
Platelets: 204 10*3/uL (ref 150–400)
RBC: 4.42 MIL/uL (ref 3.87–5.11)
RDW: 13.2 % (ref 11.5–15.5)
WBC: 9 10*3/uL (ref 4.0–10.5)
nRBC: 0 % (ref 0.0–0.2)

## 2021-05-29 LAB — TROPONIN I (HIGH SENSITIVITY): Troponin I (High Sensitivity): 2 ng/L (ref <18)

## 2021-05-29 LAB — BASIC METABOLIC PANEL
Anion gap: 7 (ref 5–15)
BUN: 14 mg/dL (ref 6–20)
CO2: 26 mmol/L (ref 22–32)
Calcium: 8.9 mg/dL (ref 8.9–10.3)
Chloride: 106 mmol/L (ref 98–111)
Creatinine, Ser: 0.52 mg/dL (ref 0.44–1.00)
GFR, Estimated: >60 mL/min (ref >60)
Glucose, Bld: 125 mg/dL — ABNORMAL HIGH (ref 70–99)
Potassium: 3.5 mmol/L (ref 3.5–5.1)
Sodium: 139 mmol/L (ref 135–145)

## 2021-05-29 LAB — POC URINE PREG, ED: Preg Test, Ur: NEGATIVE

## 2021-05-29 MED ORDER — KETOROLAC TROMETHAMINE 30 MG/ML IJ SOLN
30.0000 mg | Freq: Once | INTRAMUSCULAR | Status: AC
  Administered 2021-05-30: 30 mg via INTRAVENOUS
  Filled 2021-05-29: qty 1

## 2021-05-29 MED ORDER — DEXAMETHASONE SODIUM PHOSPHATE 10 MG/ML IJ SOLN
10.0000 mg | Freq: Once | INTRAMUSCULAR | Status: AC
  Administered 2021-05-30: 10 mg via INTRAVENOUS
  Filled 2021-05-29: qty 1

## 2021-05-29 MED ORDER — LIDOCAINE VISCOUS HCL 2 % MT SOLN
15.0000 mL | Freq: Once | OROMUCOSAL | Status: AC
  Administered 2021-05-30: 15 mL via OROMUCOSAL
  Filled 2021-05-29: qty 15

## 2021-05-29 MED ORDER — IPRATROPIUM-ALBUTEROL 0.5-2.5 (3) MG/3ML IN SOLN
3.0000 mL | Freq: Once | RESPIRATORY_TRACT | Status: AC
  Administered 2021-05-30: 3 mL via RESPIRATORY_TRACT
  Filled 2021-05-29: qty 3

## 2021-05-29 MED ORDER — SODIUM CHLORIDE 0.9 % IV BOLUS (SEPSIS)
1000.0000 mL | Freq: Once | INTRAVENOUS | Status: AC
  Administered 2021-05-30: 1000 mL via INTRAVENOUS

## 2021-05-29 NOTE — ED Triage Notes (Signed)
Pt states coming in with shortness of breath and a sore throat. Pt states she was diagnosed with pneumonia a week ago and been taking prednisone and Augmentin, but that symptoms are getting worse.  Pt reports chest pain as well.  Pt states she tested negative for covid and flu

## 2021-05-29 NOTE — ED Provider Notes (Signed)
Toni Carter  ____________________________________________   Event Date/Time   First MD Initiated Contact with Patient 05/29/21 2303     (approximate)  I have reviewed the triage vital signs and the nursing notes.   HISTORY  Chief Complaint Sore Throat and Shortness of Breath    HPI Toni Carter is a 30 y.o. female with no significant PMH who presents to the ED with one week of sore throat, cough, body aches, fever, chills.  Was seen by PCP and clinically diagnosed with PNA and started on Augmentin and prednisone.  Did not have a CXR at that time.  Tested negative for COVID 19 and influenza.  States she feels she is not getting any better.  She feels like "my throat is closing up on me".  She denies asthma, tobacco use.  No abdominal pain, V/D, dysuria, hematuria.  No known sick contacts or travel.      Past Medical History:  Diagnosis Date   Elevated liver enzymes    liver panel normal on 07-02-15   Headache    Hx MRSA infection    Torn meniscus    left     Patient Active Problem List   Diagnosis Date Noted   Menorrhagia with regular cycle 06/29/2017   Dysmenorrhea 06/29/2017   Abnormal uterine bleeding 07/04/2015    Past Surgical History:  Procedure Laterality Date   DILATATION & CURETTAGE/HYSTEROSCOPY WITH MYOSURE N/A 07/04/2015   Procedure: DILATATION & CURETTAGE/HYSTEROSCOPY WITH MYOSURE;  Surgeon: Gae Dry, MD;  Location: [facility] ORS;  Service: Gynecology;  Laterality: N/A;   FINGER TENDON REPAIR Left 10/2016   TUBAL LIGATION Bilateral 03/21/2015   Procedure: POST PARTUM TUBAL LIGATION;  Surgeon: Malachy Mood, MD;  Location: [facility] ORS;  Service: Gynecology;  Laterality: Bilateral;    Prior to Admission medications   Medication Sig Start Date End Date Taking? Authorizing Provider  albuterol (VENTOLIN HFA) 108 (90 Base) MCG/ACT inhaler Inhale 2 puffs into the lungs every 4 (four) hours as needed  for wheezing or shortness of breath. 05/30/21  Yes Demetrice Amstutz N, DO  guaiFENesin-codeine 100-10 MG/5ML syrup Take 5 mLs by mouth every 4 (four) hours as needed for cough. 05/30/21  Yes Suki Crockett, Cyril Mourning N, DO  ondansetron (ZOFRAN-ODT) 4 MG disintegrating tablet Take 1 tablet (4 mg total) by mouth every 6 (six) hours as needed for nausea or vomiting. 05/30/21  Yes Tiyon Sanor, Delice Bison, DO  Diphenhyd-Hydrocort-Nystatin (FIRST-DUKES MOUTHWASH) SUSP Use as directed 5 mLs in the mouth or throat 4 (four) times daily -  before meals and at bedtime. Patient not taking: Reported on 06/29/2017 06/24/16   Johnn Hai, PA-C  Multiple Vitamin (MULTIVITAMIN) tablet Take 1 tablet by mouth daily.    [provider]  predniSONE (STERAPRED UNI-PAK 21 TAB) 10 MG (21) TBPK tablet Take 6 pills on day one then decrease by 1 pill each day 12/26/20   Versie Starks, PA-C    Allergies Morphine and related, Hydrocodone, Tylenol [acetaminophen], Tape, and Vicodin [hydrocodone-acetaminophen]  Family History  Problem Relation Age of Onset   Lung cancer Father    Cancer Father     Social History Social History   Tobacco Use   Smoking status: Never   Smokeless tobacco: Never  Substance Use Topics   Alcohol use: Yes    Comment: RARE   Drug use: No    Review of Systems Constitutional: No fever. Eyes: No visual changes. ENT: No sore throat. Cardiovascular: Denies  chest pain. Respiratory: Denies shortness of breath. Gastrointestinal: No nausea, vomiting, diarrhea. Genitourinary: Negative for dysuria. Musculoskeletal: Negative for back pain. Skin: Negative for rash. Neurological: Negative for focal weakness or numbness.  ____________________________________________   PHYSICAL EXAM:  VITAL SIGNS: ED Triage Vitals  Enc Vitals Group     BP 05/29/21 1937 133/70     Pulse Rate 05/29/21 1937 69     Resp 05/29/21 1937 (!) 26     Temp 05/29/21 1937 98.2 F (36.8 C)     Temp src --      SpO2 05/29/21  1937 100 %     Weight 05/29/21 1938 250 lb (113.4 kg)     Height 05/29/21 1938 5\' 10"  (1.778 m)     Head Circumference --      Peak Flow --      Pain Score 05/29/21 1937 10     Pain Loc --      Pain Edu? --      Excl. in [place]? --    CONSTITUTIONAL: Alert and oriented and responds appropriately to questions. Looks uncomfortable and repeatedly coughing.  Nontoxic, afebrile. HEAD: Normocephalic EYES: Conjunctivae clear, pupils appear equal, EOM appear intact ENT: normal nose; moist mucous membranes; patient has posterior pharyngeal erythema without petechiae, no pharyngeal swelling noted, no tonsillar hypertrophy or exudate, no uvular deviation, no unilateral swelling, no trismus or drooling, no muffled voice, normal phonation, no stridor, no dental caries present, no drainable dental abscess noted, no Ludwig's angina, tongue sits flat in the bottom of the mouth, no angioedema, no facial erythema or warmth, no facial swelling; no pain with movement of the neck, no cervical LAD.  TMs are clear bilaterally without erythema, purulence, bulging, perforation, effusion.  No cerumen impaction or sign of foreign body in the external auditory canal. No inflammation, erythema or drainage from the external auditory canal. No signs of mastoiditis. No pain with manipulation of the pinna bilaterally. NECK: Supple, normal ROM, no meningismus CARD: RRR; S1 and S2 appreciated; no murmurs, no clicks, no rubs, no gallops RESP: Normal chest excursion without splinting or tachypnea; breath sounds equal bilaterally with scattered expiratory wheezing.  No rhonchi or rales.  No hypoxia or respiratory distress.  Speaking full sentences. ABD/GI: Normal bowel sounds; non-distended; soft, non-tender, no rebound, no guarding, no peritoneal signs, no hepatosplenomegaly BACK: The back appears normal EXT: Normal ROM in all joints; no deformity noted, no edema; no cyanosis SKIN: Normal color for age and race; warm; no rash on exposed  skin NEURO: Moves all extremities equally PSYCH: The patient's mood and manner are appropriate.  ____________________________________________   LABS (all labs ordered are listed, but only abnormal results are displayed)  Labs Reviewed  BASIC METABOLIC PANEL - Abnormal; Notable for the following components:      Result Value   Glucose, Bld 125 (*)    All other components within normal limits  CBC  POC URINE PREG, ED  TROPONIN I (HIGH SENSITIVITY)   ____________________________________________  EKG   EKG Interpretation  Date/Time:  Friday May 29 2021 19:40:50 EST Ventricular Rate:  64 PR Interval:  150 QRS Duration: 86 QT Interval:  408 QTC Calculation: 420 R Axis:   43 Text Interpretation: Normal sinus rhythm Cannot rule out Anterior infarct , age undetermined Abnormal ECG Confirmed by Toni Carter [phone]) on 05/29/2021 11:04:09 PM        ____________________________________________  RADIOLOGY Toni Carter, personally viewed and evaluated these images (plain radiographs) as part of my medical decision  making, as well as reviewing the written report by the radiologist.  ED MD interpretation: Chest x-ray clear.  Official radiology report(s): DG Chest 2 View  Result Date: 05/29/2021 CLINICAL DATA:  Chest pain EXAM: CHEST - 2 VIEW COMPARISON:  04/22/2017 FINDINGS: The heart size and mediastinal contours are within normal limits. Both lungs are clear. The visualized skeletal structures are unremarkable. IMPRESSION: No active cardiopulmonary disease. Electronically Signed   By: Rolm Baptise M.D.   On: 05/29/2021 19:55    ____________________________________________   PROCEDURES  Procedure(s) performed (including Critical Care):  Procedures   ____________________________________________   INITIAL IMPRESSION / ASSESSMENT AND PLAN / ED COURSE  As part of my medical decision making, I reviewed the following data within the [place] notes reviewed and incorporated, Labs reviewed , EKG interpreted , Old EKG reviewed, Old chart reviewed, Radiograph reviewed , Notes from prior ED visits, and [place] Controlled Substance Database         Patient here with symptoms of viral URI.  Discussed with her that is likely why Augmentin is not improving her symptoms as I suspect this is viral.  She would be outside of treatment window for antivirals for influenza and COVID and therefore I do not feel repeat testing is indicated.  Will give symptomatic treatment here.  Labs reassuring.  Chest x-ray clear.  EKG nonischemic.  No signs of deep space neck infection, uvulitis, PTA, meningitis on exam.  Doubt sepsis, bacteremia.  ED PROGRESS  Patient's lungs are now clear.  She reports feeling better and has been able to tolerate p.o.  Will discharge home with albuterol inhaler and cough suppressant.  Discussed supportive care instructions and return precautions.  Patient verbalized understanding and is comfortable with this plan. ____________________________________________   FINAL CLINICAL IMPRESSION(S) / ED DIAGNOSES  Final diagnoses:  Viral URI with cough  Sore throat     ED Discharge Orders          Ordered    albuterol (VENTOLIN HFA) 108 (90 Base) MCG/ACT inhaler  Every 4 hours PRN        05/30/21 0110    guaiFENesin-codeine 100-10 MG/5ML syrup  Every 4 hours PRN        05/30/21 0110    ondansetron (ZOFRAN-ODT) 4 MG disintegrating tablet  Every 6 hours PRN        05/30/21 0110            *Please Carter:  ASHERAH LAVOY was evaluated in Emergency Department on 05/30/2021 for the symptoms described in the history of present illness. She was evaluated in the context of the global COVID-19 pandemic, which necessitated consideration that the patient might be at risk for infection with the SARS-CoV-2 virus that causes COVID-19. Institutional protocols and algorithms that pertain to the evaluation of patients at risk for  COVID-19 are in a state of rapid change based on information released by regulatory bodies including the CDC and federal and state organizations. These policies and algorithms were followed during the patient's care in the ED.  Some ED evaluations and interventions may be delayed as a result of limited staffing during and the pandemic.*   Carter:  This document was prepared using Dragon voice recognition software and may include unintentional dictation errors.    Toni Carter, Delice Bison, DO 05/30/21 [phone]

## 2021-05-30 MED ORDER — ALBUTEROL SULFATE HFA 108 (90 BASE) MCG/ACT IN AERS: 2.0000 | INHALATION_SPRAY | RESPIRATORY_TRACT | 0 refills | Status: AC | PRN

## 2021-05-30 MED ORDER — ONDANSETRON 4 MG PO TBDP: 4.0000 mg | ORAL_TABLET | Freq: Four times a day (QID) | ORAL | 0 refills | Status: DC | PRN

## 2021-05-30 MED ORDER — GUAIFENESIN-CODEINE 100-10 MG/5ML PO SOLN: 5.0000 mL | ORAL | 0 refills | Status: AC | PRN

## 2021-05-30 NOTE — Discharge Instructions (Addendum)
You may alternate Tylenol 1000 mg every 6 hours as needed for pain, fever and Ibuprofen 800 mg every 8 hours as needed for pain, fever.  Please take Ibuprofen with food.  Do not take more than 4000 mg of Tylenol (acetaminophen) in a 24 hour period.   Please continue your steroids and Augmentin as prescribed.   Your work-up here has been reassuring with normal labs, EKG, chest x-ray.

## 2022-01-11 ENCOUNTER — Emergency Department
Admission: EM | Admit: 2022-01-11 | Discharge: 2022-01-11 | Discharge status: Against Medical Advice | Payer: PRIVATE HEALTH INSURANCE

## 2022-01-11 NOTE — ED Triage Notes (Signed)
No answer when called to triage x 1.

## 2022-01-11 NOTE — ED Triage Notes (Signed)
No answer when called to triage x2.

## 2022-01-11 NOTE — ED Triage Notes (Signed)
No answer when called cell phone.

## 2022-07-04 ENCOUNTER — Emergency Department
Admission: EM | Admit: 2022-07-04 | Discharge: 2022-07-04 | Disposition: A | Discharge status: Home / Self Care | Payer: PRIVATE HEALTH INSURANCE | Attending: Emergency Medicine | Admitting: Emergency Medicine

## 2022-07-04 ENCOUNTER — Other Ambulatory Visit: Payer: Self-pay

## 2022-07-04 DIAGNOSIS — M778 Other enthesopathies, not elsewhere classified: Secondary | ICD-10-CM | POA: Insufficient documentation

## 2022-07-04 MED ORDER — MELOXICAM 15 MG PO TABS: 15.0000 mg | ORAL_TABLET | Freq: Every day | ORAL | 0 refills | Status: AC

## 2022-07-04 NOTE — ED Provider Notes (Signed)
[facility] Provider Note  Patient Contact: 4:28 PM (approximate)   History   Shoulder Pain   HPI  Toni Carter is a 32 y.o. female who presents emergency department complaining of right shoulder pain.  Patient states that she been moving some furniture prior to the onset but there was no distinct injury.  Patient has decreased range of motion fields secondary to pain.  No history of previous injuries.     Physical Exam   Triage Vital Signs: ED Triage Vitals  Enc Vitals Group     BP 07/04/22 1426 133/83     Pulse Rate 07/04/22 1426 82     Resp 07/04/22 1426 18     Temp 07/04/22 1426 98.1 F (36.7 C)     Temp Source 07/04/22 1426 Oral     SpO2 07/04/22 1426 94 %     Weight 07/04/22 1427 245 lb (111.1 kg)     Height 07/04/22 1427 '5\' 10"'$  (1.778 m)     Head Circumference --      Peak Flow --      Pain Score 07/04/22 1431 4     Pain Loc --      Pain Edu? --      Excl. in [place]? --     Most recent vital signs: Vitals:   07/04/22 1426  BP: 133/83  Pulse: 82  Resp: 18  Temp: 98.1 F (36.7 C)  SpO2: 94%     General: Alert and in no acute distress. Neck: No stridor. No cervical spine tenderness to palpation. Cardiovascular:  Good peripheral perfusion Respiratory: Normal respiratory effort without tachypnea or retractions. Lungs CTAB.  Musculoskeletal: Full range of motion to all extremities.  Visualization of the right shoulder reveals no obvious signs of trauma or deformity.  Patient is able to extend, flex and abduct and abduct her shoulder though it is reduced secondary to pain.  Special test are negative currently.  Pulses sensation intact distally. Neurologic:  No gross focal neurologic deficits are appreciated.  Skin:   No rash noted Other:   ED Results / Procedures / Treatments   Labs (all labs ordered are listed, but only abnormal results are displayed) Labs Reviewed - No data to display   EKG     RADIOLOGY    No  results found.  PROCEDURES:  Critical Care performed: No  Procedures   MEDICATIONS ORDERED IN ED: Medications - No data to display   IMPRESSION / MDM / [place] / ED COURSE  I reviewed the triage vital signs and the nursing notes.                                 Differential diagnosis includes, but is not limited to, shoulder strain, rotator cuff tear, biceps tear, impingement shoulder, fracture, dislocation  Patient's presentation is most consistent with acute presentation with potential threat to life or bodily function.   Patient's diagnosis is consistent with tendinitis of the shoulder.  Patient presents emergency department with pain of the shoulder after moving some heavy furniture.  No distinct injury.  No imaging or labs performed currently.  Patient is given a sling for symptom control though encouraged to continue to move shoulder to avoid adhesive capsulitis.  Patient is given anti-inflammatory.  Follow-up with orthopedics if symptoms or not improving with conservative therapy..  Patient is given ED precautions to return to the ED for any  worsening or new symptoms.     FINAL CLINICAL IMPRESSION(S) / ED DIAGNOSES   Final diagnoses:  Tendinitis of right shoulder     Rx / DC Orders   ED Discharge Orders          Ordered    meloxicam (MOBIC) 15 MG tablet  Daily        07/04/22 1654             Note:  This document was prepared using Dragon voice recognition software and may include unintentional dictation errors.   Brynda Peon 07/04/22 1755    Blake Divine, MD 07/04/22 1810

## 2022-07-04 NOTE — ED Triage Notes (Signed)
Pt states shoulder pain for the last week. Pt states it started to hurt after she was moving furniture. Pt states trying to use ice without relief. Pt states nothing hit her arm. Pt states touching it does not hurt, but mobility hurts.

## 2023-04-06 ENCOUNTER — Emergency Department
Admission: EM | Admit: 2023-04-06 | Discharge: 2023-04-06 | Disposition: A | Discharge status: Home / Self Care | Payer: Self-pay | Attending: Emergency Medicine | Admitting: Emergency Medicine

## 2023-04-06 ENCOUNTER — Other Ambulatory Visit: Payer: Self-pay

## 2023-04-06 DIAGNOSIS — S30861A Insect bite (nonvenomous) of abdominal wall, initial encounter: Secondary | ICD-10-CM | POA: Insufficient documentation

## 2023-04-06 DIAGNOSIS — W57XXXA Bitten or stung by nonvenomous insect and other nonvenomous arthropods, initial encounter: Secondary | ICD-10-CM | POA: Insufficient documentation

## 2023-04-06 MED ORDER — DEXAMETHASONE SODIUM PHOSPHATE 10 MG/ML IJ SOLN
10.0000 mg | Freq: Once | INTRAMUSCULAR | Status: AC
  Administered 2023-04-06: 10 mg via INTRAMUSCULAR
  Filled 2023-04-06: qty 1

## 2023-04-06 NOTE — ED Triage Notes (Signed)
Patient ambulatory to triage with complaints of allergic reaction to ant bites that occurred at approx 2pm today. Denies shortness of breath, difficulty breathing, or difficulty swallowing. Patient states she has 3 red splotches across abdomen that is burning from these ant bites. She circled with sharpie and they have enlarged since.  Patient has tried 50mg  oral benadryl around 230pm and has also tried benadryl cream without relief.

## 2023-04-06 NOTE — ED Provider Notes (Signed)
   [facility] Provider Note    Event Date/Time   First MD Initiated Contact with Patient 04/06/23 2136     (approximate)   History   Allergic Reaction   HPI  Toni Carter is a 32 y.o. female who reports she was stung by fire ants earlier today and is having a allergic reaction to them, she reports 3 bites on her abdominal wall.  She reports this has happened in the past.  She denies throat swelling or intraoral symptoms or hives.  Review of record demonstrates the patient is received Decadron for this in the past with good response     Physical Exam   Triage Vital Signs: ED Triage Vitals [04/06/23 2133]  Encounter Vitals Group     BP (!) 140/94     Systolic BP Percentile      Diastolic BP Percentile      Pulse Rate (!) 58     Resp 18     Temp 97.9 F (36.6 C)     Temp Source Oral     SpO2 98 %     Weight 113.4 kg (250 lb)     Height 1.778 m (5\' 10" )     Head Circumference      Peak Flow      Pain Score 0     Pain Loc      Pain Education      Exclude from Growth Chart     Most recent vital signs: Vitals:   04/06/23 2133  BP: (!) 140/94  Pulse: (!) 58  Resp: 18  Temp: 97.9 F (36.6 C)  SpO2: 98%     General: Awake, no distress.  CV:  Good peripheral perfusion.  Resp:  Normal effort.  Abd:  No distention.  3 areas of raised erythema on the abdominal wall more consistent with inflammatory response to insect bites then significant allergic reaction Other:     ED Results / Procedures / Treatments   Labs (all labs ordered are listed, but only abnormal results are displayed) Labs Reviewed - No data to display   EKG     RADIOLOGY     PROCEDURES:  Critical Care performed:   Procedures   MEDICATIONS ORDERED IN ED: Medications  dexamethasone (DECADRON) injection 10 mg (10 mg Intramuscular Given 04/06/23 2152)     IMPRESSION / MDM / ASSESSMENT AND PLAN / ED COURSE  I reviewed the triage vital signs and the  nursing notes. Patient's presentation is most consistent with acute, uncomplicated illness.   Patient well-appearing without signs of anaphylaxis, given IM Decadron for symptom relief, no indication for admission at this time, appropriate for discharge with outpatient follow-up.       FINAL CLINICAL IMPRESSION(S) / ED DIAGNOSES   Final diagnoses:  Insect bite of abdominal wall, initial encounter     Rx / DC Orders   ED Discharge Orders     None        Note:  This document was prepared using Dragon voice recognition software and may include unintentional dictation errors.   Jene Every, MD 04/06/23 2217

## 2023-11-08 ENCOUNTER — Other Ambulatory Visit: Payer: Self-pay | Admitting: Family Medicine

## 2023-11-08 DIAGNOSIS — R1011 Right upper quadrant pain: Secondary | ICD-10-CM

## 2023-11-22 ENCOUNTER — Other Ambulatory Visit: Payer: Self-pay | Admitting: Family Medicine

## 2023-11-22 DIAGNOSIS — R1013 Epigastric pain: Secondary | ICD-10-CM

## 2023-11-22 DIAGNOSIS — R109 Unspecified abdominal pain: Secondary | ICD-10-CM

## 2023-11-24 ENCOUNTER — Ambulatory Visit: Payer: Self-pay

## 2023-11-25 ENCOUNTER — Ambulatory Visit: Payer: Self-pay

## 2023-11-25 ENCOUNTER — Ambulatory Visit
Admission: RE | Admit: 2023-11-25 | Discharge: 2023-11-25 | Disposition: A | Discharge status: Home / Self Care | Payer: Self-pay | Source: Ambulatory Visit | Attending: Family Medicine | Admitting: Family Medicine

## 2023-11-25 DIAGNOSIS — R1013 Epigastric pain: Secondary | ICD-10-CM | POA: Insufficient documentation

## 2023-11-25 DIAGNOSIS — R109 Unspecified abdominal pain: Secondary | ICD-10-CM | POA: Insufficient documentation

## 2024-03-30 ENCOUNTER — Other Ambulatory Visit: Payer: Self-pay | Admitting: Family

## 2024-03-30 ENCOUNTER — Ambulatory Visit
Admission: RE | Admit: 2024-03-30 | Discharge: 2024-03-30 | Disposition: A | Discharge status: Home / Self Care | Source: Ambulatory Visit | Attending: Family | Admitting: Family

## 2024-03-30 DIAGNOSIS — N289 Disorder of kidney and ureter, unspecified: Secondary | ICD-10-CM

## 2024-03-30 DIAGNOSIS — R3129 Other microscopic hematuria: Secondary | ICD-10-CM

## 2024-03-30 MED ORDER — IOHEXOL 300 MG/ML  SOLN
100.0000 mL | Freq: Once | INTRAMUSCULAR | Status: AC | PRN
  Administered 2024-03-30: 100 mL via INTRAVENOUS

## 2024-06-29 ENCOUNTER — Other Ambulatory Visit: Payer: Self-pay

## 2024-06-29 ENCOUNTER — Emergency Department
Admission: EM | Admit: 2024-06-29 | Discharge: 2024-06-29 | Disposition: A | Discharge status: Home / Self Care | Attending: Emergency Medicine | Admitting: Emergency Medicine

## 2024-06-29 DIAGNOSIS — T383X5A Adverse effect of insulin and oral hypoglycemic [antidiabetic] drugs, initial encounter: Secondary | ICD-10-CM | POA: Diagnosis not present

## 2024-06-29 DIAGNOSIS — T50905A Adverse effect of unspecified drugs, medicaments and biological substances, initial encounter: Secondary | ICD-10-CM

## 2024-06-29 DIAGNOSIS — R1084 Generalized abdominal pain: Secondary | ICD-10-CM | POA: Diagnosis present

## 2024-06-29 DIAGNOSIS — R112 Nausea with vomiting, unspecified: Secondary | ICD-10-CM | POA: Insufficient documentation

## 2024-06-29 LAB — URINALYSIS, ROUTINE W REFLEX MICROSCOPIC
Bacteria, UA: NONE SEEN
Bilirubin Urine: NEGATIVE
Glucose, UA: NEGATIVE mg/dL
Ketones, ur: NEGATIVE mg/dL
Leukocytes,Ua: NEGATIVE
Nitrite: NEGATIVE
Protein, ur: NEGATIVE mg/dL
Specific Gravity, Urine: 1.025 (ref 1.005–1.030)
pH: 6 (ref 5.0–8.0)

## 2024-06-29 LAB — CBC
HCT: 40.3 % (ref 36.0–46.0)
Hemoglobin: 13.5 g/dL (ref 12.0–15.0)
MCH: 29.1 pg (ref 26.0–34.0)
MCHC: 33.5 g/dL (ref 30.0–36.0)
MCV: 86.9 fL (ref 80.0–100.0)
Platelets: 222 K/uL (ref 150–400)
RBC: 4.64 MIL/uL (ref 3.87–5.11)
RDW: 13.1 % (ref 11.5–15.5)
WBC: 8.3 K/uL (ref 4.0–10.5)
nRBC: 0 % (ref 0.0–0.2)

## 2024-06-29 LAB — COMPREHENSIVE METABOLIC PANEL WITH GFR
ALT: 28 U/L (ref 0–44)
AST: 17 U/L (ref 15–41)
Albumin: 4.5 g/dL (ref 3.5–5.0)
Alkaline Phosphatase: 77 U/L (ref 38–126)
Anion gap: 11 (ref 5–15)
BUN: 10 mg/dL (ref 6–20)
CO2: 26 mmol/L (ref 22–32)
Calcium: 9.5 mg/dL (ref 8.9–10.3)
Chloride: 103 mmol/L (ref 98–111)
Creatinine, Ser: 0.62 mg/dL (ref 0.44–1.00)
GFR, Estimated: >60 mL/min
Glucose, Bld: 102 mg/dL — ABNORMAL HIGH (ref 70–99)
Potassium: 4.7 mmol/L (ref 3.5–5.1)
Sodium: 139 mmol/L (ref 135–145)
Total Bilirubin: 0.7 mg/dL (ref 0.0–1.2)
Total Protein: 7.2 g/dL (ref 6.5–8.1)

## 2024-06-29 LAB — LIPASE, BLOOD: Lipase: 32 U/L (ref 11–51)

## 2024-06-29 LAB — POC URINE PREG, ED: Preg Test, Ur: NEGATIVE

## 2024-06-29 MED ORDER — DIPHENHYDRAMINE HCL 50 MG/ML IJ SOLN
25.0000 mg | Freq: Once | INTRAMUSCULAR | Status: AC
  Administered 2024-06-29: 25 mg via INTRAVENOUS
  Filled 2024-06-29: qty 1

## 2024-06-29 MED ORDER — LACTATED RINGERS IV BOLUS
1000.0000 mL | Freq: Once | INTRAVENOUS | Status: AC
  Administered 2024-06-29: 1000 mL via INTRAVENOUS

## 2024-06-29 MED ORDER — ONDANSETRON 4 MG PO TBDP
4.0000 mg | ORAL_TABLET | Freq: Once | ORAL | Status: AC
  Administered 2024-06-29: 4 mg via ORAL
  Filled 2024-06-29: qty 1

## 2024-06-29 MED ORDER — ONDANSETRON 4 MG PO TBDP: ORAL_TABLET | ORAL | 0 refills | Status: AC

## 2024-06-29 MED ORDER — DICYCLOMINE HCL 10 MG PO CAPS
20.0000 mg | ORAL_CAPSULE | Freq: Once | ORAL | Status: AC
  Administered 2024-06-29: 20 mg via ORAL
  Filled 2024-06-29: qty 2

## 2024-06-29 MED ORDER — PROMETHAZINE HCL 25 MG RE SUPP: 25.0000 mg | Freq: Four times a day (QID) | RECTAL | 0 refills | Status: AC | PRN

## 2024-06-29 MED ORDER — METOCLOPRAMIDE HCL 5 MG/ML IJ SOLN
10.0000 mg | Freq: Once | INTRAMUSCULAR | Status: AC
  Administered 2024-06-29: 10 mg via INTRAVENOUS
  Filled 2024-06-29: qty 2

## 2024-06-29 MED ORDER — ONDANSETRON HCL 4 MG/2ML IJ SOLN
4.0000 mg | INTRAMUSCULAR | Status: AC
  Administered 2024-06-29: 4 mg via INTRAVENOUS
  Filled 2024-06-29: qty 2

## 2024-06-29 NOTE — Discharge Instructions (Addendum)
 As we discussed, we agree that you are having side effects from the medication you gave yourself.  Please avoid any additional medication doses and follow-up with your regular provider.  Use the prescribed medications as needed according to label instructions.  Return to the emergency department if you develop new or worsening symptoms that concern you

## 2024-06-29 NOTE — ED Provider Notes (Signed)
 "  [facility] Provider Note    Event Date/Time   First MD Initiated Contact with Patient 06/29/24 [phone]     (approximate)   History   Abdominal Pain   HPI Toni Carter is a 34 y.o. female who presents for evaluation of severe nausea and vomiting with some generalized abdominal pain and cramping since yesterday.  She said that she took an Ozempic pen from a friend but she does not know where the friend got it or exactly what the dose was.  She personally used Ozempic about a year ago and had some side effects from it but she thinks it was a lower dose than what she took yesterday.  Not long after giving herself the injection and she started to feel ill and has not been able to keep anything down since that time.  She has not had any diarrhea.  The biggest issue is the nausea and vomiting, and the generalized abdominal pain seems to have come after the multiple episodes of emesis.     Physical Exam   Triage Vital Signs: ED Triage Vitals [06/29/24 0230]  Encounter Vitals Group     BP (!) 141/87     Girls Systolic BP Percentile      Girls Diastolic BP Percentile      Boys Systolic BP Percentile      Boys Diastolic BP Percentile      Pulse Rate 70     Resp 20     Temp 97.8 F (36.6 C)     Temp Source Oral     SpO2 100 %     Weight 99.8 kg (220 lb)     Height 1.727 m (5' 8)     Head Circumference      Peak Flow      Pain Score 0     Pain Loc      Pain Education      Exclude from Growth Chart     Most recent vital signs: Vitals:   06/29/24 0546 06/29/24 0721  BP: 119/73 120/80  Pulse: (!) 57 62  Resp: 16 14  Temp: 97.8 F (36.6 C)   SpO2: 97% 97%    General: Awake, pleasant and conversant, generally well-appearing but looks a bit uncomfortable. CV:  Good peripheral perfusion.  Regular rate and rhythm. Resp:  Normal effort. Speaking easily and comfortably, no accessory muscle usage nor intercostal retractions.   Abd:  No distention.  No  tenderness to palpation of the abdomen, soft, no guarding.   ED Results / Procedures / Treatments   Labs (all labs ordered are listed, but only abnormal results are displayed) Labs Reviewed  COMPREHENSIVE METABOLIC PANEL WITH GFR - Abnormal; Notable for the following components:      Result Value   Glucose, Bld 102 (*)    All other components within normal limits  URINALYSIS, ROUTINE W REFLEX MICROSCOPIC - Abnormal; Notable for the following components:   Color, Urine YELLOW (*)    APPearance CLEAR (*)    Hgb urine dipstick MODERATE (*)    All other components within normal limits  LIPASE, BLOOD  CBC  POC URINE PREG, ED       PROCEDURES:  Critical Care performed: No  Procedures    IMPRESSION / MDM / ASSESSMENT AND PLAN / ED COURSE  I reviewed the triage vital signs and the nursing notes.  Differential diagnosis includes, but is not limited to, medication side effect, viral or bacterial gastritis, electrolyte abnormality.  Patient's presentation is most consistent with acute presentation with potential threat to life or bodily function.  Labs/studies ordered (see ED course for additional labs and studies that may have been added later): CMP, lipase, urinalysis, CBC, ED urine pregnancy test  Interventions/Medications given:  Medications  ondansetron  (ZOFRAN -ODT) disintegrating tablet 4 mg (4 mg Oral Given 06/29/24 0543)  lactated ringers  bolus 1,000 mL (1,000 mLs Intravenous New Bag/Given 06/29/24 9361)  ondansetron  (ZOFRAN ) injection 4 mg (4 mg Intravenous Given 06/29/24 9361)  dicyclomine  (BENTYL ) capsule 20 mg (20 mg Oral Given 06/29/24 9360)    (Note:  hospital course my include additional interventions and/or labs/studies not listed above.)   Reassuring lab workup and physical exam.  I do believe her symptoms are a result of the medication.  We talked about avoiding this counter medication and situation in the future.  She would feel  better with some IV fluids and I ordered additional medications as listed above and prescriptions as listed below.  The patient's medical screening exam is reassuring with no indication of an emergent medical condition requiring hospitalization or additional evaluation at this point.  The patient is safe and appropriate for discharge and outpatient follow up.         FINAL CLINICAL IMPRESSION(S) / ED DIAGNOSES   Final diagnoses:  Nausea and vomiting, unspecified vomiting type  Generalized abdominal pain  Medication side effect, initial encounter     Rx / DC Orders   ED Discharge Orders          Ordered    ondansetron  (ZOFRAN -ODT) 4 MG disintegrating tablet        06/29/24 0730    promethazine  (PHENERGAN ) 25 MG suppository  Every 6 hours PRN        06/29/24 0730             Note:  This document was prepared using Dragon voice recognition software and may include unintentional dictation errors.   Gordan Huxley, MD 06/29/24 [phone]  "

## 2024-06-29 NOTE — ED Triage Notes (Signed)
 Pt reports she used someone elses ozempic and she reports she used what was in the whole pen, pt reports it read 60. Pt c/o n/v. Pt took it yesterday around 1400
